# Patient Record
Sex: Female | Born: 1985 | Race: Black or African American | Hispanic: No | Marital: Single | Smoking: Never smoker
Health system: Southern US, Community
[De-identification: ages and names within clinical notes are randomized; demographics above are authoritative.]

## PROBLEM LIST (undated history)

## (undated) DIAGNOSIS — I10 Essential (primary) hypertension: Secondary | ICD-10-CM

## (undated) DIAGNOSIS — E669 Obesity, unspecified: Secondary | ICD-10-CM

## (undated) DIAGNOSIS — L940 Localized scleroderma [morphea]: Secondary | ICD-10-CM

## (undated) DIAGNOSIS — E079 Disorder of thyroid, unspecified: Secondary | ICD-10-CM

## (undated) DIAGNOSIS — F419 Anxiety disorder, unspecified: Secondary | ICD-10-CM

---

## 2006-08-04 ENCOUNTER — Emergency Department (HOSPITAL_COMMUNITY): Admission: EM | Admit: 2006-08-04 | Discharge: 2006-08-04 | Payer: Self-pay | Admitting: Emergency Medicine

## 2006-12-12 ENCOUNTER — Emergency Department (HOSPITAL_COMMUNITY): Admission: EM | Admit: 2006-12-12 | Discharge: 2006-12-13 | Payer: Self-pay | Admitting: Emergency Medicine

## 2006-12-17 ENCOUNTER — Ambulatory Visit: Payer: Self-pay | Admitting: *Deleted

## 2007-01-18 ENCOUNTER — Ambulatory Visit: Payer: Self-pay | Admitting: Internal Medicine

## 2007-02-12 ENCOUNTER — Ambulatory Visit: Payer: Self-pay | Admitting: Internal Medicine

## 2007-03-19 ENCOUNTER — Ambulatory Visit: Payer: Self-pay | Admitting: Internal Medicine

## 2007-09-08 ENCOUNTER — Emergency Department (HOSPITAL_COMMUNITY): Admission: EM | Admit: 2007-09-08 | Discharge: 2007-09-08 | Payer: Self-pay | Admitting: Emergency Medicine

## 2007-11-22 ENCOUNTER — Inpatient Hospital Stay (HOSPITAL_COMMUNITY): Admission: AD | Admit: 2007-11-22 | Discharge: 2007-11-22 | Payer: Self-pay | Admitting: Gynecology

## 2007-11-25 ENCOUNTER — Ambulatory Visit: Payer: Self-pay | Admitting: Gynecology

## 2007-11-25 ENCOUNTER — Ambulatory Visit (HOSPITAL_COMMUNITY): Admission: RE | Admit: 2007-11-25 | Discharge: 2007-11-25 | Payer: Self-pay | Admitting: Gynecology

## 2007-11-25 ENCOUNTER — Encounter (INDEPENDENT_AMBULATORY_CARE_PROVIDER_SITE_OTHER): Payer: Self-pay | Admitting: Gynecology

## 2009-10-02 ENCOUNTER — Inpatient Hospital Stay (HOSPITAL_COMMUNITY): Admission: AD | Admit: 2009-10-02 | Discharge: 2009-10-02 | Payer: Self-pay | Admitting: Obstetrics & Gynecology

## 2010-11-18 ENCOUNTER — Emergency Department (HOSPITAL_COMMUNITY)
Admission: EM | Admit: 2010-11-18 | Discharge: 2010-11-18 | Payer: Self-pay | Source: Home / Self Care | Admitting: Emergency Medicine

## 2010-11-28 LAB — URINALYSIS, ROUTINE W REFLEX MICROSCOPIC
Bilirubin Urine: NEGATIVE
Ketones, ur: NEGATIVE mg/dL
Nitrite: NEGATIVE
Protein, ur: NEGATIVE mg/dL
Specific Gravity, Urine: 1.021 (ref 1.005–1.030)
Urine Glucose, Fasting: NEGATIVE mg/dL
Urobilinogen, UA: 1 mg/dL (ref 0.0–1.0)
pH: 6.5 (ref 5.0–8.0)

## 2010-11-28 LAB — URINE MICROSCOPIC-ADD ON

## 2010-11-28 LAB — POCT PREGNANCY, URINE: Preg Test, Ur: NEGATIVE

## 2010-12-23 ENCOUNTER — Emergency Department (HOSPITAL_COMMUNITY)
Admission: EM | Admit: 2010-12-23 | Discharge: 2010-12-23 | Disposition: A | Discharge status: Home / Self Care | Payer: Medicaid Other | Attending: Emergency Medicine | Admitting: Emergency Medicine

## 2010-12-23 DIAGNOSIS — D649 Anemia, unspecified: Secondary | ICD-10-CM | POA: Insufficient documentation

## 2010-12-23 DIAGNOSIS — E669 Obesity, unspecified: Secondary | ICD-10-CM | POA: Insufficient documentation

## 2010-12-23 DIAGNOSIS — J45909 Unspecified asthma, uncomplicated: Secondary | ICD-10-CM | POA: Insufficient documentation

## 2010-12-23 DIAGNOSIS — L940 Localized scleroderma [morphea]: Secondary | ICD-10-CM | POA: Insufficient documentation

## 2010-12-23 DIAGNOSIS — R5381 Other malaise: Secondary | ICD-10-CM | POA: Insufficient documentation

## 2010-12-23 DIAGNOSIS — N390 Urinary tract infection, site not specified: Secondary | ICD-10-CM | POA: Insufficient documentation

## 2010-12-23 DIAGNOSIS — M2550 Pain in unspecified joint: Secondary | ICD-10-CM | POA: Insufficient documentation

## 2010-12-23 LAB — DIFFERENTIAL
Basophils Absolute: 0 10*3/uL (ref 0.0–0.1)
Basophils Relative: 0 % (ref 0–1)
Eosinophils Absolute: 0.2 10*3/uL (ref 0.0–0.7)
Eosinophils Relative: 2 % (ref 0–5)
Lymphocytes Relative: 23 % (ref 12–46)
Lymphs Abs: 2.7 10*3/uL (ref 0.7–4.0)
Monocytes Absolute: 0.8 10*3/uL (ref 0.1–1.0)
Monocytes Relative: 7 % (ref 3–12)
Neutro Abs: 7.9 10*3/uL — ABNORMAL HIGH (ref 1.7–7.7)
Neutrophils Relative %: 68 % (ref 43–77)

## 2010-12-23 LAB — URINALYSIS, ROUTINE W REFLEX MICROSCOPIC
Bilirubin Urine: NEGATIVE
Ketones, ur: NEGATIVE mg/dL
Nitrite: POSITIVE — AB
Protein, ur: NEGATIVE mg/dL
Specific Gravity, Urine: 1.022 (ref 1.005–1.030)
Urine Glucose, Fasting: NEGATIVE mg/dL
Urobilinogen, UA: 1 mg/dL (ref 0.0–1.0)
pH: 6 (ref 5.0–8.0)

## 2010-12-23 LAB — CBC
HCT: 32.5 % — ABNORMAL LOW (ref 36.0–46.0)
Hemoglobin: 10.1 g/dL — ABNORMAL LOW (ref 12.0–15.0)
MCH: 22.1 pg — ABNORMAL LOW (ref 26.0–34.0)
MCHC: 31.1 g/dL (ref 30.0–36.0)
MCV: 71 fL — ABNORMAL LOW (ref 78.0–100.0)
Platelets: 323 10*3/uL (ref 150–400)
RBC: 4.58 MIL/uL (ref 3.87–5.11)
RDW: 17.6 % — ABNORMAL HIGH (ref 11.5–15.5)
WBC: 11.6 10*3/uL — ABNORMAL HIGH (ref 4.0–10.5)

## 2010-12-23 LAB — POCT I-STAT, CHEM 8
BUN: 8 mg/dL (ref 6–23)
Calcium, Ion: 1.17 mmol/L (ref 1.12–1.32)
Chloride: 103 mEq/L (ref 96–112)
Creatinine, Ser: 0.9 mg/dL (ref 0.4–1.2)
Glucose, Bld: 89 mg/dL (ref 70–99)
HCT: 34 % — ABNORMAL LOW (ref 36.0–46.0)
Hemoglobin: 11.6 g/dL — ABNORMAL LOW (ref 12.0–15.0)
Potassium: 3.6 mEq/L (ref 3.5–5.1)
Sodium: 143 mEq/L (ref 135–145)
TCO2: 27 mmol/L (ref 0–100)

## 2010-12-23 LAB — URINE MICROSCOPIC-ADD ON

## 2010-12-23 LAB — POCT PREGNANCY, URINE: Preg Test, Ur: NEGATIVE

## 2010-12-25 LAB — URINE CULTURE
Colony Count: 40000
Culture  Setup Time: 201202110205

## 2010-12-30 ENCOUNTER — Emergency Department (HOSPITAL_COMMUNITY)
Admission: EM | Admit: 2010-12-30 | Discharge: 2010-12-31 | Disposition: A | Discharge status: Home / Self Care | Payer: Medicaid Other | Attending: Emergency Medicine | Admitting: Emergency Medicine

## 2010-12-30 DIAGNOSIS — K029 Dental caries, unspecified: Secondary | ICD-10-CM | POA: Insufficient documentation

## 2010-12-30 DIAGNOSIS — J45909 Unspecified asthma, uncomplicated: Secondary | ICD-10-CM | POA: Insufficient documentation

## 2010-12-30 DIAGNOSIS — K089 Disorder of teeth and supporting structures, unspecified: Secondary | ICD-10-CM | POA: Insufficient documentation

## 2010-12-30 DIAGNOSIS — L940 Localized scleroderma [morphea]: Secondary | ICD-10-CM | POA: Insufficient documentation

## 2010-12-30 DIAGNOSIS — E669 Obesity, unspecified: Secondary | ICD-10-CM | POA: Insufficient documentation

## 2011-01-10 ENCOUNTER — Emergency Department (HOSPITAL_COMMUNITY)
Admission: EM | Admit: 2011-01-10 | Discharge: 2011-01-10 | Disposition: A | Discharge status: Home / Self Care | Payer: Medicaid Other | Attending: Emergency Medicine | Admitting: Emergency Medicine

## 2011-01-10 ENCOUNTER — Emergency Department (HOSPITAL_COMMUNITY): Payer: Medicaid Other

## 2011-01-10 DIAGNOSIS — M545 Low back pain, unspecified: Secondary | ICD-10-CM | POA: Insufficient documentation

## 2011-01-10 DIAGNOSIS — M79609 Pain in unspecified limb: Secondary | ICD-10-CM | POA: Insufficient documentation

## 2011-01-10 DIAGNOSIS — R55 Syncope and collapse: Secondary | ICD-10-CM | POA: Insufficient documentation

## 2011-01-10 LAB — BASIC METABOLIC PANEL
BUN: 5 mg/dL — ABNORMAL LOW (ref 6–23)
CO2: 26 mEq/L (ref 19–32)
Calcium: 8.8 mg/dL (ref 8.4–10.5)
Chloride: 106 mEq/L (ref 96–112)
Creatinine, Ser: 0.64 mg/dL (ref 0.4–1.2)
GFR calc Af Amer: >60 mL/min (ref >60)
GFR calc non Af Amer: >60 mL/min (ref >60)
Glucose, Bld: 86 mg/dL (ref 70–99)
Potassium: 3.4 mEq/L — ABNORMAL LOW (ref 3.5–5.1)
Sodium: 139 mEq/L (ref 135–145)

## 2011-01-10 LAB — URINE MICROSCOPIC-ADD ON

## 2011-01-10 LAB — DIFFERENTIAL
Basophils Absolute: 0 10*3/uL (ref 0.0–0.1)
Basophils Relative: 0 % (ref 0–1)
Eosinophils Absolute: 0.2 10*3/uL (ref 0.0–0.7)
Eosinophils Relative: 2 % (ref 0–5)
Lymphocytes Relative: 21 % (ref 12–46)
Lymphs Abs: 1.9 10*3/uL (ref 0.7–4.0)
Monocytes Absolute: 0.6 10*3/uL (ref 0.1–1.0)
Monocytes Relative: 7 % (ref 3–12)
Neutro Abs: 6.4 10*3/uL (ref 1.7–7.7)
Neutrophils Relative %: 70 % (ref 43–77)

## 2011-01-10 LAB — URINALYSIS, ROUTINE W REFLEX MICROSCOPIC
Bilirubin Urine: NEGATIVE
Ketones, ur: NEGATIVE mg/dL
Nitrite: NEGATIVE
Protein, ur: 30 mg/dL — AB
Specific Gravity, Urine: 1.015 (ref 1.005–1.030)
Urine Glucose, Fasting: NEGATIVE mg/dL
Urobilinogen, UA: 0.2 mg/dL (ref 0.0–1.0)
pH: 7.5 (ref 5.0–8.0)

## 2011-01-10 LAB — PREGNANCY, URINE: Preg Test, Ur: NEGATIVE

## 2011-01-10 LAB — CBC
HCT: 33.3 % — ABNORMAL LOW (ref 36.0–46.0)
Hemoglobin: 10.3 g/dL — ABNORMAL LOW (ref 12.0–15.0)
MCH: 21.8 pg — ABNORMAL LOW (ref 26.0–34.0)
MCHC: 30.9 g/dL (ref 30.0–36.0)
MCV: 70.6 fL — ABNORMAL LOW (ref 78.0–100.0)
Platelets: 281 10*3/uL (ref 150–400)
RBC: 4.72 MIL/uL (ref 3.87–5.11)
RDW: 17.1 % — ABNORMAL HIGH (ref 11.5–15.5)
WBC: 9.1 10*3/uL (ref 4.0–10.5)

## 2011-02-15 LAB — URINALYSIS, ROUTINE W REFLEX MICROSCOPIC
Bilirubin Urine: NEGATIVE
Glucose, UA: NEGATIVE mg/dL
Hgb urine dipstick: NEGATIVE
Ketones, ur: NEGATIVE mg/dL
Nitrite: POSITIVE — AB
Protein, ur: NEGATIVE mg/dL
Specific Gravity, Urine: 1.02 (ref 1.005–1.030)
Urobilinogen, UA: 0.2 mg/dL (ref 0.0–1.0)
pH: 6 (ref 5.0–8.0)

## 2011-02-15 LAB — URINE MICROSCOPIC-ADD ON

## 2011-02-15 LAB — GC/CHLAMYDIA PROBE AMP, GENITAL: GC Probe Amp, Genital: NEGATIVE

## 2011-02-15 LAB — POCT PREGNANCY, URINE: Preg Test, Ur: POSITIVE

## 2011-02-15 LAB — WET PREP, GENITAL: Clue Cells Wet Prep HPF POC: NONE SEEN

## 2011-03-05 ENCOUNTER — Emergency Department (HOSPITAL_COMMUNITY)
Admission: EM | Admit: 2011-03-05 | Discharge: 2011-03-05 | Disposition: A | Discharge status: Home / Self Care | Payer: Medicaid Other | Attending: Emergency Medicine | Admitting: Emergency Medicine

## 2011-03-05 DIAGNOSIS — R0982 Postnasal drip: Secondary | ICD-10-CM | POA: Insufficient documentation

## 2011-03-05 DIAGNOSIS — J45909 Unspecified asthma, uncomplicated: Secondary | ICD-10-CM | POA: Insufficient documentation

## 2011-03-05 DIAGNOSIS — I10 Essential (primary) hypertension: Secondary | ICD-10-CM | POA: Insufficient documentation

## 2011-03-05 DIAGNOSIS — R059 Cough, unspecified: Secondary | ICD-10-CM | POA: Insufficient documentation

## 2011-03-05 DIAGNOSIS — R05 Cough: Secondary | ICD-10-CM | POA: Insufficient documentation

## 2011-03-05 DIAGNOSIS — J40 Bronchitis, not specified as acute or chronic: Secondary | ICD-10-CM | POA: Insufficient documentation

## 2011-03-05 DIAGNOSIS — J3489 Other specified disorders of nose and nasal sinuses: Secondary | ICD-10-CM | POA: Insufficient documentation

## 2011-03-27 ENCOUNTER — Other Ambulatory Visit: Payer: Self-pay | Admitting: Specialist

## 2011-03-27 ENCOUNTER — Ambulatory Visit
Admission: RE | Admit: 2011-03-27 | Discharge: 2011-03-27 | Disposition: A | Discharge status: Home / Self Care | Payer: Medicaid Other | Source: Ambulatory Visit | Attending: Specialist | Admitting: Specialist

## 2011-03-27 DIAGNOSIS — M545 Low back pain: Secondary | ICD-10-CM

## 2011-03-28 NOTE — Op Note (Signed)
NAMECHRISTENA, Wendy Humphrey               ACCOUNT NO.:  [id number]   MEDICAL RECORD NO.:  [id number]          PATIENT TYPE:  AMB   LOCATION:  SDC                           FACILITY:  [facility]   PHYSICIAN:  Ginger Carne, MD  DATE OF BIRTH:  [birth year]   DATE OF PROCEDURE:  11/25/2007  DATE OF DISCHARGE:                               OPERATIVE REPORT   PREOPERATIVE DIAGNOSIS:  First trimester missed abortion.   POSTOPERATIVE DIAGNOSIS:  First trimester missed abortion.   PROCEDURE:  Exploration, dilation, and extraction.   SURGEON:  Ginger Carne, M.D.   ASSISTANT:  None.   COMPLICATIONS:  None immediate.   ESTIMATED BLOOD LOSS:  Minimal.   SPECIMENS:  To pathology.   FINDINGS:  External genitalia, vulva, and vagina normal.  Cervix is  smooth without erosions or lesions.  Uterus was 10 weeks in size by  sound and size.  Products of conception noted.  Both adnexa palpable and  found to be normal.   DESCRIPTION OF PROCEDURE:  The patient was prepped and draped in the  usual sterile fashion and placed in the lithotomy position.  Betadine  solution was used for antiseptic and the patient was catheterized prior  to the procedure.  After adequate general anesthesia, tenaculum was  placed on the anterior lip of the cervix.  Dilatation to accommodate a  #8 suction cannula was followed by sharp and then suction curettage.  The specimen to pathology.  Minimal bleeding noted at the end of the  procedure.  The patient tolerated the procedure well and returned to the  postanesthesia care unit in excellent condition.      Ginger Carne, MD  Electronically Signed     SHB/MEDQ  D:  11/25/2007  T:  11/25/2007  Job:  [id number]

## 2011-04-07 ENCOUNTER — Emergency Department (HOSPITAL_COMMUNITY)
Admission: EM | Admit: 2011-04-07 | Discharge: 2011-04-07 | Discharge status: Against Medical Advice | Payer: Medicaid Other | Attending: Emergency Medicine | Admitting: Emergency Medicine

## 2011-04-07 DIAGNOSIS — R109 Unspecified abdominal pain: Secondary | ICD-10-CM | POA: Insufficient documentation

## 2011-04-07 DIAGNOSIS — R112 Nausea with vomiting, unspecified: Secondary | ICD-10-CM | POA: Insufficient documentation

## 2011-05-20 ENCOUNTER — Emergency Department (HOSPITAL_COMMUNITY)
Admission: EM | Admit: 2011-05-20 | Discharge: 2011-05-20 | Disposition: A | Discharge status: Home / Self Care | Payer: Medicaid Other | Attending: Emergency Medicine | Admitting: Emergency Medicine

## 2011-05-20 DIAGNOSIS — T63391A Toxic effect of venom of other spider, accidental (unintentional), initial encounter: Secondary | ICD-10-CM | POA: Insufficient documentation

## 2011-05-20 DIAGNOSIS — T6391XA Toxic effect of contact with unspecified venomous animal, accidental (unintentional), initial encounter: Secondary | ICD-10-CM | POA: Insufficient documentation

## 2011-05-20 DIAGNOSIS — R509 Fever, unspecified: Secondary | ICD-10-CM | POA: Insufficient documentation

## 2011-05-20 DIAGNOSIS — L02219 Cutaneous abscess of trunk, unspecified: Secondary | ICD-10-CM | POA: Insufficient documentation

## 2011-05-20 DIAGNOSIS — I10 Essential (primary) hypertension: Secondary | ICD-10-CM | POA: Insufficient documentation

## 2011-08-03 LAB — CBC
HCT: 33.2 — ABNORMAL LOW
Hemoglobin: 11 — ABNORMAL LOW
MCHC: 33.6
MCV: 81.9
Platelets: 264
Platelets: 265
RBC: 4.03
WBC: 9.1

## 2011-08-03 LAB — TYPE AND SCREEN
ABO/RH(D): AB POS
Antibody Screen: NEGATIVE

## 2011-08-03 LAB — POCT PREGNANCY, URINE
Operator id: 220991
Preg Test, Ur: POSITIVE

## 2011-08-03 LAB — ABO/RH: ABO/RH(D): AB POS

## 2011-08-23 LAB — URINALYSIS, ROUTINE W REFLEX MICROSCOPIC
Glucose, UA: NEGATIVE
Ketones, ur: NEGATIVE
Nitrite: NEGATIVE
Protein, ur: NEGATIVE
Urobilinogen, UA: 0.2

## 2011-08-23 LAB — CBC
Hemoglobin: 12.1
MCHC: 33
Platelets: 275
RDW: 14.6 — ABNORMAL HIGH

## 2011-08-23 LAB — COMPREHENSIVE METABOLIC PANEL
ALT: 10
Albumin: 3.4 — ABNORMAL LOW
Alkaline Phosphatase: 88
Calcium: 8.8
Glucose, Bld: 76
Potassium: 3.5
Sodium: 136
Total Protein: 7.6

## 2011-08-23 LAB — URINE MICROSCOPIC-ADD ON

## 2011-08-23 LAB — URINE CULTURE: Colony Count: >=100000

## 2011-08-23 LAB — GC/CHLAMYDIA PROBE AMP, GENITAL
Chlamydia, DNA Probe: NEGATIVE
GC Probe Amp, Genital: NEGATIVE

## 2011-08-23 LAB — DIFFERENTIAL
Eosinophils Absolute: 0.2
Lymphs Abs: 1.3
Monocytes Absolute: 0.6
Monocytes Relative: 7
Neutro Abs: 6.6
Neutrophils Relative %: 76

## 2011-08-23 LAB — WET PREP, GENITAL
Trich, Wet Prep: NONE SEEN
Yeast Wet Prep HPF POC: NONE SEEN

## 2011-11-17 ENCOUNTER — Emergency Department (HOSPITAL_COMMUNITY): Payer: Medicaid Other

## 2011-11-17 ENCOUNTER — Emergency Department (HOSPITAL_COMMUNITY)
Admission: EM | Admit: 2011-11-17 | Discharge: 2011-11-17 | Disposition: A | Discharge status: Home / Self Care | Payer: Medicaid Other | Attending: Emergency Medicine | Admitting: Emergency Medicine

## 2011-11-17 ENCOUNTER — Encounter: Payer: Self-pay | Admitting: *Deleted

## 2011-11-17 DIAGNOSIS — R0602 Shortness of breath: Secondary | ICD-10-CM | POA: Insufficient documentation

## 2011-11-17 DIAGNOSIS — M542 Cervicalgia: Secondary | ICD-10-CM | POA: Insufficient documentation

## 2011-11-17 DIAGNOSIS — R1110 Vomiting, unspecified: Secondary | ICD-10-CM | POA: Insufficient documentation

## 2011-11-17 DIAGNOSIS — R070 Pain in throat: Secondary | ICD-10-CM | POA: Insufficient documentation

## 2011-11-17 DIAGNOSIS — R059 Cough, unspecified: Secondary | ICD-10-CM | POA: Insufficient documentation

## 2011-11-17 DIAGNOSIS — R51 Headache: Secondary | ICD-10-CM | POA: Insufficient documentation

## 2011-11-17 DIAGNOSIS — R05 Cough: Secondary | ICD-10-CM | POA: Insufficient documentation

## 2011-11-17 DIAGNOSIS — I10 Essential (primary) hypertension: Secondary | ICD-10-CM | POA: Insufficient documentation

## 2011-11-17 DIAGNOSIS — A599 Trichomoniasis, unspecified: Secondary | ICD-10-CM

## 2011-11-17 DIAGNOSIS — R112 Nausea with vomiting, unspecified: Secondary | ICD-10-CM | POA: Insufficient documentation

## 2011-11-17 DIAGNOSIS — R062 Wheezing: Secondary | ICD-10-CM | POA: Insufficient documentation

## 2011-11-17 HISTORY — DX: Localized scleroderma (morphea): L94.0

## 2011-11-17 HISTORY — DX: Obesity, unspecified: E66.9

## 2011-11-17 LAB — URINE MICROSCOPIC-ADD ON

## 2011-11-17 LAB — URINALYSIS, ROUTINE W REFLEX MICROSCOPIC
Glucose, UA: NEGATIVE mg/dL
Hgb urine dipstick: NEGATIVE
Ketones, ur: NEGATIVE mg/dL
Protein, ur: NEGATIVE mg/dL
Urobilinogen, UA: 0.2 mg/dL (ref 0.0–1.0)

## 2011-11-17 MED ORDER — KETOROLAC TROMETHAMINE 60 MG/2ML IM SOLN
60.0000 mg | Freq: Once | INTRAMUSCULAR | Status: AC
  Administered 2011-11-17: 60 mg via INTRAMUSCULAR
  Filled 2011-11-17: qty 2

## 2011-11-17 MED ORDER — CIPROFLOXACIN HCL 500 MG PO TABS
500.0000 mg | ORAL_TABLET | Freq: Once | ORAL | Status: AC
  Administered 2011-11-17: 500 mg via ORAL
  Filled 2011-11-17: qty 1

## 2011-11-17 MED ORDER — HYDROCODONE-ACETAMINOPHEN 5-325 MG PO TABS: 1.0000 | ORAL_TABLET | Freq: Four times a day (QID) | ORAL | Status: AC | PRN

## 2011-11-17 MED ORDER — ALBUTEROL SULFATE (5 MG/ML) 0.5% IN NEBU
5.0000 mg | INHALATION_SOLUTION | Freq: Once | RESPIRATORY_TRACT | Status: AC
  Administered 2011-11-17: 5 mg via RESPIRATORY_TRACT
  Filled 2011-11-17: qty 1

## 2011-11-17 MED ORDER — HYDROCODONE-ACETAMINOPHEN 5-325 MG PO TABS
2.0000 | ORAL_TABLET | Freq: Once | ORAL | Status: AC
  Administered 2011-11-17: 2 via ORAL
  Filled 2011-11-17 (×2): qty 1

## 2011-11-17 MED ORDER — ONDANSETRON 4 MG PO TBDP
4.0000 mg | ORAL_TABLET | Freq: Once | ORAL | Status: AC
  Administered 2011-11-17: 4 mg via ORAL
  Filled 2011-11-17: qty 1

## 2011-11-17 MED ORDER — METRONIDAZOLE 500 MG PO TABS
2000.0000 mg | ORAL_TABLET | Freq: Once | ORAL | Status: AC
  Administered 2011-11-17: 2000 mg via ORAL
  Filled 2011-11-17: qty 4

## 2011-11-17 MED ORDER — CIPROFLOXACIN HCL 500 MG PO TABS: 500.0000 mg | ORAL_TABLET | Freq: Two times a day (BID) | ORAL | Status: AC

## 2011-11-17 MED ORDER — DEXAMETHASONE SODIUM PHOSPHATE 10 MG/ML IJ SOLN
10.0000 mg | Freq: Once | INTRAMUSCULAR | Status: AC
  Administered 2011-11-17: 10 mg via INTRAMUSCULAR
  Filled 2011-11-17: qty 1

## 2011-11-17 NOTE — ED Notes (Signed)
Reports migraine headache and n/v x 2 days. Having a cough and sob. Airway is intact at triage. Skin w/d.

## 2011-11-17 NOTE — ED Provider Notes (Signed)
History     CSN: [id number]  Arrival date & time 11/17/11  1032   First MD Initiated Contact with Patient 11/17/11 1153     12:28 PM HPI Patient reports for the last 2 days has had coughing, wheezing coughing shortness of breath, and a migraine headache. Reports migraine headache associated. Causing nausea and vomiting as well. Denies fever, rhinorrhea, midline neck pain. Reports migraine headache is throbbing and frontal. Denies numbness, tingling, weakness, facial droop, difficult to with speech or walking. Denies chest pain. Patient is a 26 y.o. female presenting with headaches. The history is provided by the patient.  Headache  This is a new problem. The current episode started 2 days ago. The problem occurs constantly. The headache is associated with nothing. The pain is located in the frontal region. The pain is moderate. The pain radiates to the right neck. Associated symptoms include shortness of breath and vomiting. Pertinent negatives include no anorexia, no fever, no malaise/fatigue, no chest pressure, no near-syncope, no orthopnea, no palpitations, no syncope and no nausea.    Past Medical History  Diagnosis Date  . Obesity   . Asthma   . Morphea   . Migraine     History reviewed. No pertinent past surgical history.  History reviewed. No pertinent family history.  History  Substance Use Topics  . Smoking status: Not on file  . Smokeless tobacco: Not on file  . Alcohol Use: No    OB History    Grav Para Term Preterm Abortions TAB SAB Ect Mult Living                  Review of Systems  Constitutional: Negative for fever, chills and malaise/fatigue.  HENT: Positive for sore throat and neck pain (right sided). Negative for ear pain, congestion, rhinorrhea, neck stiffness and sinus pressure.   Respiratory: Positive for cough and shortness of breath.   Cardiovascular: Negative for chest pain, palpitations, orthopnea, syncope and near-syncope.  Gastrointestinal:  Positive for vomiting. Negative for nausea, abdominal pain and anorexia.  Neurological: Positive for headaches. Negative for dizziness, tremors, seizures, speech difficulty, weakness, light-headedness and numbness.  All other systems reviewed and are negative.    Allergies  Review of patient's allergies indicates no known allergies.  Home Medications  No current outpatient prescriptions on file.  BP 161/105  Pulse 93  Temp(Src) 97.9 F (36.6 C) (Oral)  Resp 18  SpO2 98%  LMP 10/29/2011  Physical Exam  Vitals reviewed. Constitutional: She is oriented to person, place, and time. Vital signs are normal. She appears well-developed and well-nourished.       Hypertension  HENT:  Head: Normocephalic and atraumatic. No trismus in the jaw.  Right Ear: Tympanic membrane, external ear and ear canal normal.  Left Ear: Tympanic membrane, external ear and ear canal normal.  Nose: Nose normal. Right sinus exhibits no maxillary sinus tenderness and no frontal sinus tenderness. Left sinus exhibits no maxillary sinus tenderness and no frontal sinus tenderness.  Mouth/Throat: Uvula is midline and oropharynx is clear and moist. No dental abscesses or uvula swelling. No oropharyngeal exudate, posterior oropharyngeal edema, posterior oropharyngeal erythema or tonsillar abscesses.  Eyes: Conjunctivae are normal. Pupils are equal, round, and reactive to light.  Neck: Normal range of motion and full passive range of motion without pain. Neck supple. No spinous process tenderness and no muscular tenderness present. No rigidity. Normal range of motion present. No thyromegaly present.  Cardiovascular: Normal rate, regular rhythm and normal heart sounds.  Exam reveals no friction rub.   No murmur heard. Pulmonary/Chest: Effort normal and breath sounds normal. She has no wheezes. She has no rhonchi. She has no rales. She exhibits no tenderness.  Abdominal: Soft. Bowel sounds are normal. She exhibits no  distension and no mass. There is no tenderness. There is no rebound and no guarding.  Musculoskeletal: Normal range of motion. She exhibits no edema and no tenderness.  Neurological: She is alert and oriented to person, place, and time. No cranial nerve deficit. Coordination normal.  Skin: Skin is warm and dry. No rash noted. No erythema. No pallor.    ED Course  Procedures Results for orders placed during the hospital encounter of 11/17/11  URINALYSIS, ROUTINE W REFLEX MICROSCOPIC      Component Value Range   Color, Urine YELLOW  YELLOW    APPearance HAZY (*) CLEAR    Specific Gravity, Urine 1.025  1.005 - 1.030    pH 7.0  5.0 - 8.0    Glucose, UA NEGATIVE  NEGATIVE (mg/dL)   Hgb urine dipstick NEGATIVE  NEGATIVE    Bilirubin Urine NEGATIVE  NEGATIVE    Ketones, ur NEGATIVE  NEGATIVE (mg/dL)   Protein, ur NEGATIVE  NEGATIVE (mg/dL)   Urobilinogen, UA 0.2  0.0 - 1.0 (mg/dL)   Nitrite POSITIVE (*) NEGATIVE    Leukocytes, UA MODERATE (*) NEGATIVE   POCT PREGNANCY, URINE      Component Value Range   Preg Test, Ur NEGATIVE    URINE MICROSCOPIC-ADD ON      Component Value Range   Squamous Epithelial / LPF MANY (*) RARE    WBC, UA 7-10  <3 (WBC/hpf)   RBC / HPF 0-2  <3 (RBC/hpf)   Bacteria, UA MANY (*) RARE    Urine-Other TRICHOMONAS PRESENT     Dg Chest 2 View  11/17/2011  *RADIOLOGY REPORT*  Clinical Data: Cough with wheezing.  CHEST - 2 VIEW  Comparison: None.  Findings: Normal heart size with clear lung fields.  No bony abnormality.  IMPRESSION: No active cardiopulmonary disease.  Original Report Authenticated By: Elsie Stain, M.D.     MDM         Thomasene Lot, [place] 11/17/11 [phone]

## 2011-11-17 NOTE — ED Notes (Signed)
Pt staes cough wheezing and vomiting x 2-3 days

## 2011-11-21 NOTE — ED Provider Notes (Signed)
Medical screening examination/treatment/procedure(s) were performed by non-physician practitioner and as supervising physician I was immediately available for consultation/collaboration.   Xareni Kelch, MD 11/21/11 1252 

## 2012-07-08 IMAGING — CR DG LUMBAR SPINE COMPLETE 4+V
5 series · 5 of 5 positions shown · non-contrast
Comparison: Lumbar spine films of 01/10/2011

CLINICAL DATA: Lower back pain radiating down legs, no trauma

LUMBAR SPINE - COMPLETE 4+ VIEW

[t l-spine a.p.]
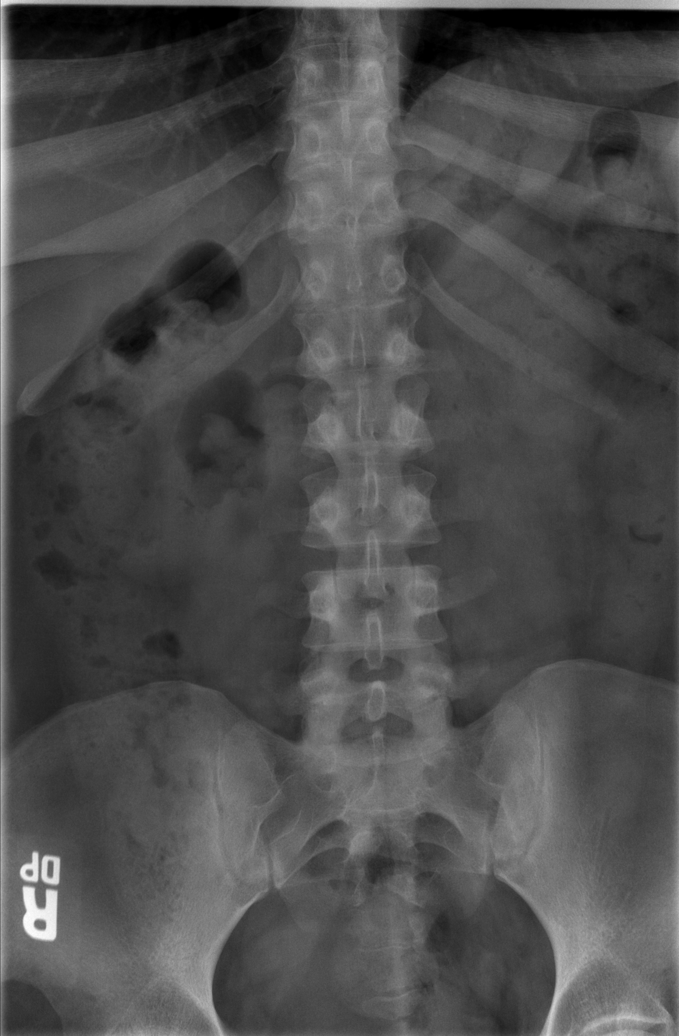

[t l-spine oblique exposure (1 of 2)]
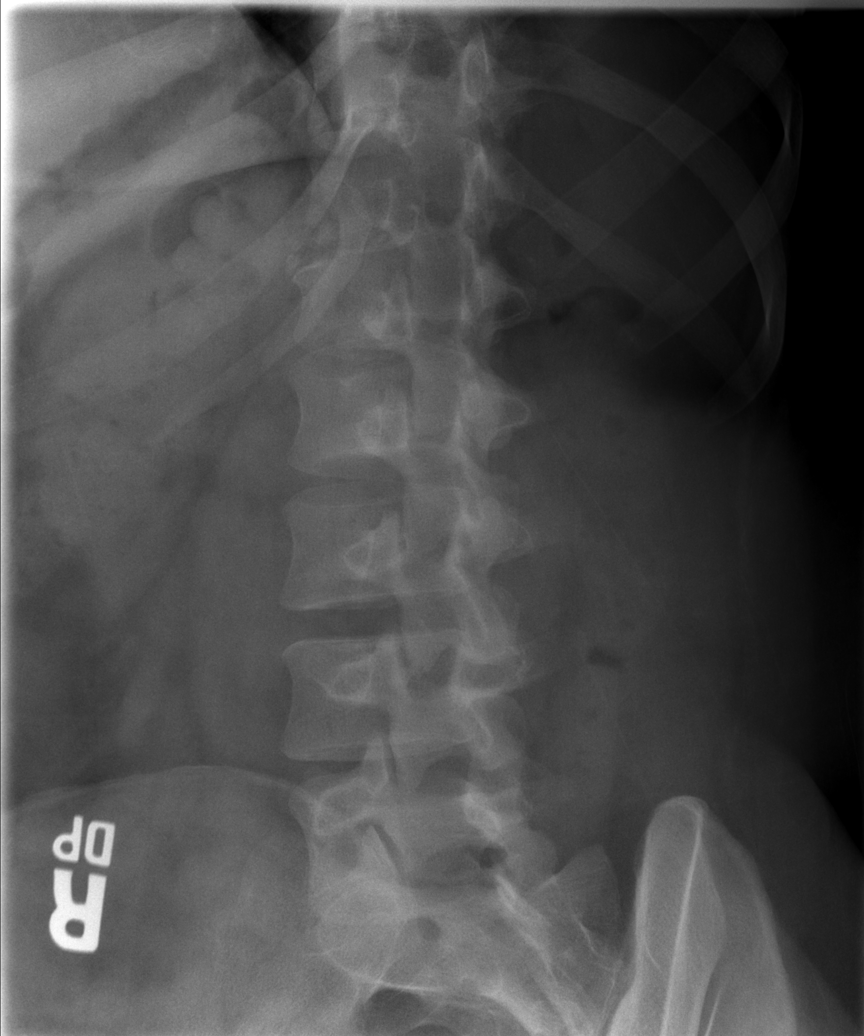

[t l-spine oblique exposure (2 of 2)]
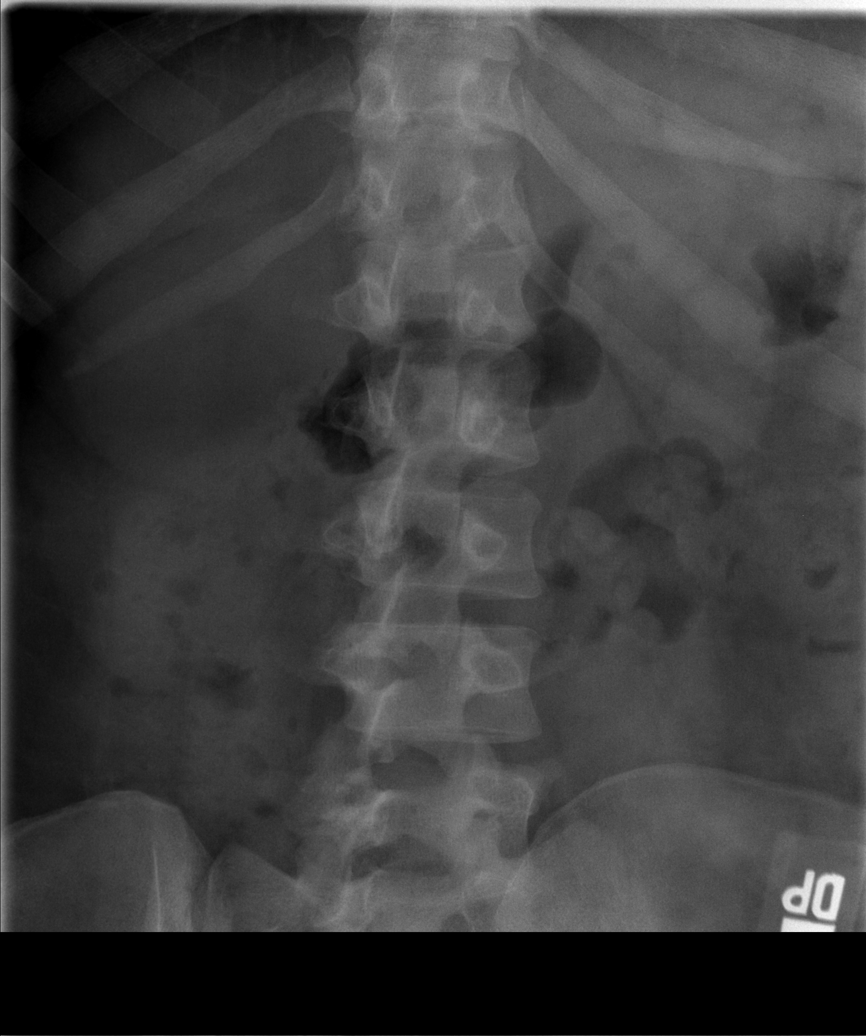

[t l-spine lat]
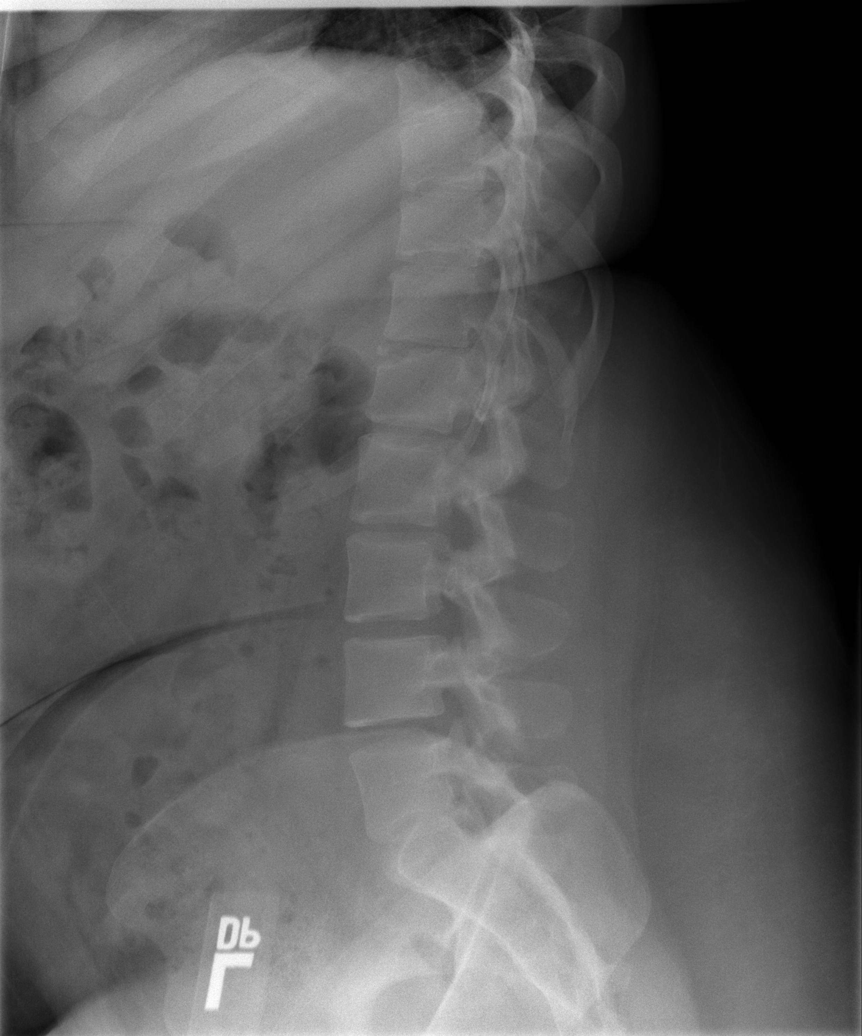

[t l-spine l5-s1 spot]
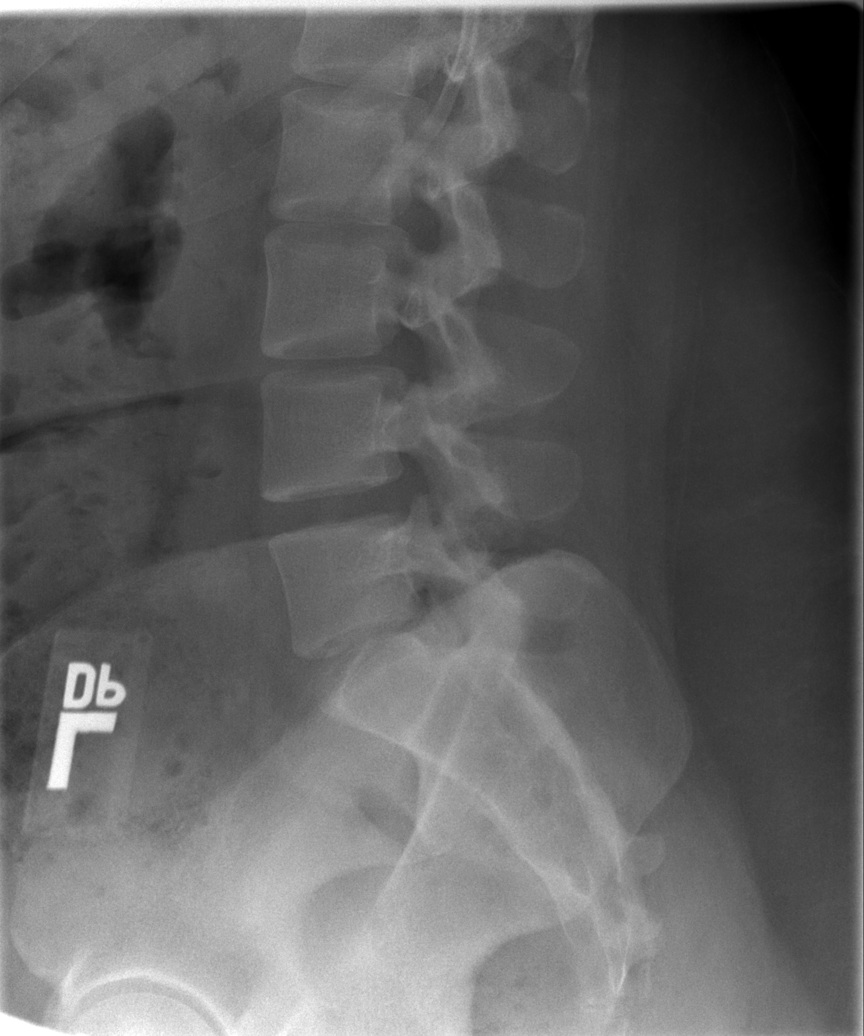

[5 of 5 positions shown; findings below may reference images not displayed]

FINDINGS: The lumbar vertebrae remain in normal alignment with
normal intervertebral disc spaces.  No compression deformity is
seen.  Variation of a limbus vertebra involving the anterior
superior aspect of L1 is noted which is a normal variation.  The SI
joints appear normal.
IMPRESSION: Normal alignment.  Normal disc spaces.  No acute abnormality.

## 2014-05-26 ENCOUNTER — Encounter (HOSPITAL_COMMUNITY): Payer: Self-pay | Admitting: Emergency Medicine

## 2014-05-26 ENCOUNTER — Emergency Department (HOSPITAL_COMMUNITY)
Admission: EM | Admit: 2014-05-26 | Discharge: 2014-05-26 | Disposition: A | Discharge status: Home / Self Care | Payer: Medicaid Other | Attending: Emergency Medicine | Admitting: Emergency Medicine

## 2014-05-26 DIAGNOSIS — I10 Essential (primary) hypertension: Secondary | ICD-10-CM | POA: Diagnosis not present

## 2014-05-26 DIAGNOSIS — H579 Unspecified disorder of eye and adnexa: Secondary | ICD-10-CM | POA: Diagnosis present

## 2014-05-26 DIAGNOSIS — E669 Obesity, unspecified: Secondary | ICD-10-CM | POA: Insufficient documentation

## 2014-05-26 DIAGNOSIS — Z79899 Other long term (current) drug therapy: Secondary | ICD-10-CM | POA: Diagnosis not present

## 2014-05-26 DIAGNOSIS — H5710 Ocular pain, unspecified eye: Secondary | ICD-10-CM | POA: Insufficient documentation

## 2014-05-26 DIAGNOSIS — Z872 Personal history of diseases of the skin and subcutaneous tissue: Secondary | ICD-10-CM | POA: Diagnosis not present

## 2014-05-26 DIAGNOSIS — H5711 Ocular pain, right eye: Secondary | ICD-10-CM

## 2014-05-26 DIAGNOSIS — J45909 Unspecified asthma, uncomplicated: Secondary | ICD-10-CM | POA: Insufficient documentation

## 2014-05-26 HISTORY — DX: Essential (primary) hypertension: I10

## 2014-05-26 MED ORDER — IBUPROFEN 200 MG PO TABS
600.0000 mg | ORAL_TABLET | Freq: Once | ORAL | Status: AC
  Administered 2014-05-26: 600 mg via ORAL
  Filled 2014-05-26: qty 3

## 2014-05-26 MED ORDER — HYDROCODONE-ACETAMINOPHEN 5-325 MG PO TABS: 1.0000 | ORAL_TABLET | ORAL | Status: DC | PRN

## 2014-05-26 MED ORDER — OXYCODONE-ACETAMINOPHEN 5-325 MG PO TABS
1.0000 | ORAL_TABLET | Freq: Once | ORAL | Status: AC
  Administered 2014-05-26: 1 via ORAL
  Filled 2014-05-26: qty 1

## 2014-05-26 NOTE — ED Notes (Signed)
Called mother for ride home as requested.

## 2014-05-26 NOTE — ED Notes (Signed)
She states " i think my blood pressure might be up because ive had pressure behind my eyes and felt faint since this morning." shes A&Ox4, breathing easily

## 2014-05-26 NOTE — ED Provider Notes (Signed)
CSN: [id number]     Arrival date & time 05/26/14  1734 History   First MD Initiated Contact with Patient 05/26/14 1807     Chief Complaint  Patient presents with  . Hypertension      HPI Patient reports a pressure sensation behind her right eye.  She reports no change in her vision.  She denies dental pain or upper respiratory symptoms.  No fevers or chills.  No significant pain with range of motion of her eyes.  She's never had issues before.  She's not wear corrective lenses.  She denies chest pain shortness of breath.  No abdominal pain.  No nausea or vomiting.  No recent trauma or injury.  No change in her vision.  She's concerned this could be related to her blood pressure   Past Medical History  Diagnosis Date  . Obesity   . Asthma   . Morphea   . Migraine   . Hypertension    History reviewed. No pertinent past surgical history. History reviewed. No pertinent family history. History  Substance Use Topics  . Smoking status: Never Smoker   . Smokeless tobacco: Not on file  . Alcohol Use: No   OB History   Grav Para Term Preterm Abortions TAB SAB Ect Mult Living                 Review of Systems    Allergies  Review of patient's allergies indicates no known allergies.  Home Medications   Prior to Admission medications   Medication Sig Start Date End Date Taking? Authorizing Provider  metFORMIN (GLUCOPHAGE) 500 MG tablet Take 500 mg by mouth 2 (two) times daily with a meal.   Yes Historical Provider, MD   BP 150/109  Pulse 103  Temp(Src) 98 F (36.7 C) (Oral)  Resp 18  Ht 5\' 4"  (1.626 m)  Wt 345 lb 11.2 oz (156.808 kg)  BMI 59.31 kg/m2  SpO2 100% Physical Exam  Nursing note and vitals reviewed. Constitutional: She is oriented to person, place, and time. She appears well-developed and well-nourished. No distress.  HENT:  Head: Normocephalic and atraumatic.  Eyes: EOM and lids are normal. Right eye exhibits no chemosis and no discharge. Left eye exhibits  no chemosis and no discharge. Right conjunctiva is not injected. Right conjunctiva has no hemorrhage. Left conjunctiva is not injected. Left conjunctiva has no hemorrhage. Right eye exhibits normal extraocular motion and no nystagmus. Left eye exhibits normal extraocular motion and no nystagmus. Right pupil is round and reactive. Left pupil is round and reactive. Pupils are equal.  Neck: Normal range of motion.  Cardiovascular: Normal rate, regular rhythm and normal heart sounds.   Pulmonary/Chest: Effort normal and breath sounds normal.  Abdominal: Soft. She exhibits no distension. There is no tenderness.  Musculoskeletal: Normal range of motion.  Neurological: She is alert and oriented to person, place, and time.  5/5 strength in major muscle groups of  bilateral upper and lower extremities. Speech normal. No facial asymetry.   Skin: Skin is warm and dry.  Psychiatric: She has a normal mood and affect. Judgment normal.    ED Course  Procedures (including critical care time) Labs Review Labs Reviewed - No data to display  Imaging Review No results found.   EKG Interpretation   Date/Time:  Tuesday May 26 2014 17:45:10 EDT Ventricular Rate:  96 PR Interval:  142 QRS Duration: 82 QT Interval:  370 QTC Calculation: 467 R Axis:   51 Text Interpretation:  Normal sinus rhythm T wave abnormality, consider  inferior ischemia Prolonged QT Abnormal ECG No significant change was  found Confirmed by Suad Autrey  MD, Lennette Bihari ([id number]) on 05/26/2014 6:10:49 PM      MDM   Final diagnoses:  None    Patient feels better.  May represent headache.  Home with ophthalmology followup.  No indication for additional testing and thigh.  No blurred vision.  Discharge home in good condition.  No indication for CT imaging    Hoy Morn, MD 05/26/14 1907

## 2014-05-26 NOTE — ED Notes (Signed)
Patient states pain level is a 6 now. She says that her headaches is easing up a little bit.

## 2014-05-26 NOTE — ED Notes (Signed)
Pt's mother's number for discharge. Shirlean Mylar [phone]. Call for discharge when ready

## 2014-05-26 NOTE — Discharge Instructions (Signed)

## 2014-05-26 NOTE — ED Notes (Signed)
Dr. Campos at the bedside.  

## 2014-07-09 ENCOUNTER — Emergency Department (HOSPITAL_COMMUNITY)
Admission: EM | Admit: 2014-07-09 | Discharge: 2014-07-09 | Discharge status: Against Medical Advice | Payer: Medicaid Other | Attending: Emergency Medicine | Admitting: Emergency Medicine

## 2014-07-09 ENCOUNTER — Emergency Department (HOSPITAL_COMMUNITY): Payer: Medicaid Other

## 2014-07-09 ENCOUNTER — Encounter (HOSPITAL_COMMUNITY): Payer: Self-pay | Admitting: Emergency Medicine

## 2014-07-09 DIAGNOSIS — I10 Essential (primary) hypertension: Secondary | ICD-10-CM | POA: Diagnosis not present

## 2014-07-09 DIAGNOSIS — J45909 Unspecified asthma, uncomplicated: Secondary | ICD-10-CM | POA: Diagnosis not present

## 2014-07-09 DIAGNOSIS — E669 Obesity, unspecified: Secondary | ICD-10-CM | POA: Diagnosis not present

## 2014-07-09 DIAGNOSIS — R51 Headache: Secondary | ICD-10-CM | POA: Insufficient documentation

## 2014-07-09 MED ORDER — OXYCODONE-ACETAMINOPHEN 5-325 MG PO TABS
1.0000 | ORAL_TABLET | Freq: Once | ORAL | Status: AC
  Administered 2014-07-09: 1 via ORAL
  Filled 2014-07-09: qty 1

## 2014-07-09 NOTE — ED Notes (Signed)
Pt approached [facility] stating she was going to leave. RN encouraged patient to stay that she was next to be taken back to a treatment room. Pt states she needed to leave due to family issues at home. Pt verbalized that she would return if her symptoms get worse or do not improve.

## 2014-07-09 NOTE — ED Notes (Signed)
Present with 2-3 weeks of occiput pain described as shooting and sharp and intermittent, pain is on the back top right side of head and not currently hurting at this time. When pain is gone-it is gone, but whenit is there it is severe and associated with dizziness and right arm "feels like it locks up" no pain at this time. Pt alert and oriented, MAEx4.

## 2014-10-30 ENCOUNTER — Emergency Department (HOSPITAL_COMMUNITY)
Admission: EM | Admit: 2014-10-30 | Discharge: 2014-10-30 | Disposition: A | Discharge status: Home / Self Care | Payer: Medicaid Other | Attending: Emergency Medicine | Admitting: Emergency Medicine

## 2014-10-30 ENCOUNTER — Emergency Department (HOSPITAL_COMMUNITY): Payer: Medicaid Other

## 2014-10-30 ENCOUNTER — Encounter (HOSPITAL_COMMUNITY): Payer: Self-pay | Admitting: Emergency Medicine

## 2014-10-30 DIAGNOSIS — I10 Essential (primary) hypertension: Secondary | ICD-10-CM | POA: Insufficient documentation

## 2014-10-30 DIAGNOSIS — R0602 Shortness of breath: Secondary | ICD-10-CM

## 2014-10-30 DIAGNOSIS — Z3202 Encounter for pregnancy test, result negative: Secondary | ICD-10-CM | POA: Diagnosis not present

## 2014-10-30 DIAGNOSIS — Z872 Personal history of diseases of the skin and subcutaneous tissue: Secondary | ICD-10-CM | POA: Diagnosis not present

## 2014-10-30 DIAGNOSIS — E669 Obesity, unspecified: Secondary | ICD-10-CM | POA: Insufficient documentation

## 2014-10-30 DIAGNOSIS — R079 Chest pain, unspecified: Secondary | ICD-10-CM | POA: Diagnosis present

## 2014-10-30 DIAGNOSIS — N390 Urinary tract infection, site not specified: Secondary | ICD-10-CM | POA: Diagnosis not present

## 2014-10-30 DIAGNOSIS — J45901 Unspecified asthma with (acute) exacerbation: Secondary | ICD-10-CM | POA: Diagnosis not present

## 2014-10-30 DIAGNOSIS — Z79899 Other long term (current) drug therapy: Secondary | ICD-10-CM | POA: Insufficient documentation

## 2014-10-30 LAB — URINALYSIS, ROUTINE W REFLEX MICROSCOPIC
Bilirubin Urine: NEGATIVE
GLUCOSE, UA: NEGATIVE mg/dL
HGB URINE DIPSTICK: NEGATIVE
Ketones, ur: NEGATIVE mg/dL
Nitrite: POSITIVE — AB
PROTEIN: NEGATIVE mg/dL
SPECIFIC GRAVITY, URINE: 1.027 (ref 1.005–1.030)
UROBILINOGEN UA: 1 mg/dL (ref 0.0–1.0)
pH: 6 (ref 5.0–8.0)

## 2014-10-30 LAB — BASIC METABOLIC PANEL
Anion gap: 12 (ref 5–15)
BUN: 12 mg/dL (ref 6–23)
CALCIUM: 9.1 mg/dL (ref 8.4–10.5)
CO2: 23 meq/L (ref 19–32)
Chloride: 102 mEq/L (ref 96–112)
Creatinine, Ser: 0.77 mg/dL (ref 0.50–1.10)
GFR calc Af Amer: >90 mL/min (ref >90)
GLUCOSE: 84 mg/dL (ref 70–99)
Potassium: 3.4 mEq/L — ABNORMAL LOW (ref 3.7–5.3)
Sodium: 137 mEq/L (ref 137–147)

## 2014-10-30 LAB — CBC
HEMATOCRIT: 31.8 % — AB (ref 36.0–46.0)
HEMOGLOBIN: 9.7 g/dL — AB (ref 12.0–15.0)
MCH: 21.9 pg — AB (ref 26.0–34.0)
MCHC: 30.5 g/dL (ref 30.0–36.0)
MCV: 71.8 fL — AB (ref 78.0–100.0)
Platelets: 267 10*3/uL (ref 150–400)
RBC: 4.43 MIL/uL (ref 3.87–5.11)
RDW: 17.2 % — ABNORMAL HIGH (ref 11.5–15.5)
WBC: 12.1 10*3/uL — ABNORMAL HIGH (ref 4.0–10.5)

## 2014-10-30 LAB — I-STAT TROPONIN, ED: Troponin i, poc: 0 ng/mL (ref 0.00–0.08)

## 2014-10-30 LAB — URINE MICROSCOPIC-ADD ON

## 2014-10-30 LAB — POC URINE PREG, ED: Preg Test, Ur: NEGATIVE

## 2014-10-30 MED ORDER — CEPHALEXIN 500 MG PO CAPS: 500.0000 mg | ORAL_CAPSULE | Freq: Four times a day (QID) | ORAL | Status: DC

## 2014-10-30 MED ORDER — ALBUTEROL SULFATE HFA 108 (90 BASE) MCG/ACT IN AERS
2.0000 | INHALATION_SPRAY | RESPIRATORY_TRACT | Status: DC | PRN
  Administered 2014-10-30: 2 via RESPIRATORY_TRACT
  Filled 2014-10-30: qty 6.7

## 2014-10-30 NOTE — ED Notes (Signed)
Patient transported to X-ray 

## 2014-10-30 NOTE — ED Notes (Signed)
Pt c/o chest heaviness while at work. Reports having pain this morning and went into work. Initially, she thought "the cold air was making my chest hurt since I have asthma." Heaviness persisted associated with shortness of breath. Pt also c/o low back pain; hx of UTI; denies burning with urination

## 2014-10-30 NOTE — Discharge Instructions (Signed)
Return to the ED with any concerns including difficulty breathing despite using 2 puffs of albuterol every 4 hours, not drinking fluids, decreased urine output, vomiting and not able to keep down liquids or medications, decreased level of alertness/lethargy, or any other alarming symptoms

## 2014-10-30 NOTE — ED Notes (Signed)
Pt now stating that she is also having back pain and thinks she may have UTI.

## 2014-10-30 NOTE — ED Notes (Signed)
Pt reports chest heaviness all day at work with headache. sts pains where getting worse throughout the day. Skin warm and dry. resp e/u.

## 2014-10-30 NOTE — ED Provider Notes (Signed)
CSN: [id number]     Arrival date & time 10/30/14  1959 History   First MD Initiated Contact with Patient 10/30/14 2107     Chief Complaint  Patient presents with  . Chest Pain     (Consider location/radiation/quality/duration/timing/severity/associated sxs/prior Treatment) HPI  Pt with hx of asthma presents with c/o chest heaviness and tightness with her breathing.  She also c/o some dysuria.  Symptoms began this morning.  She feels the cold air may have made her symptoms worse.  No fever/cough.  She does not have an albuterol inhaler but does have an advair diskus which did not provide any relief.  States symptoms feel similar to her prior asthma.  No leg swelling.  No hx of DVT/PE, no recent travel/trauma or surgery.  Symptoms are constant.  They are not exertional in nature.  There are no other associated systemic symptoms, there are no other alleviating or modifying factors.   Past Medical History  Diagnosis Date  . Obesity   . Asthma   . Morphea   . Migraine   . Hypertension    History reviewed. No pertinent past surgical history. No family history on file. History  Substance Use Topics  . Smoking status: Never Smoker   . Smokeless tobacco: Not on file  . Alcohol Use: No   OB History    No data available     Review of Systems  ROS reviewed and all otherwise negative except for mentioned in HPI    Allergies  Review of patient's allergies indicates no known allergies.  Home Medications   Prior to Admission medications   Medication Sig Start Date End Date Taking? Authorizing Provider  amLODipine (NORVASC) 10 MG tablet Take 10 mg by mouth daily. 09/14/14  Yes Historical Provider, MD  Levothyroxine Sodium (SYNTHROID PO) Take 1 tablet by mouth daily.   Yes Historical Provider, MD  lisinopril (PRINIVIL,ZESTRIL) 40 MG tablet Take 40 mg by mouth daily. 09/14/14  Yes Historical Provider, MD  metoprolol (LOPRESSOR) 50 MG tablet Take 50 mg by mouth 2 (two) times daily. 09/14/14   Yes Historical Provider, MD  PROAIR HFA 108 (90 BASE) MCG/ACT inhaler Inhale 2 puffs into the lungs daily as needed for wheezing or shortness of breath.  10/29/14  Yes Historical Provider, MD  cephALEXin (KEFLEX) 500 MG capsule Take 1 capsule (500 mg total) by mouth 4 (four) times daily. 10/30/14   Threasa Beards, MD   BP 154/91 mmHg  Pulse 79  Temp(Src) 97.5 F (36.4 C) (Oral)  Resp 16  Ht 5\' 4"  (1.626 m)  Wt 345 lb (156.491 kg)  BMI 59.19 kg/m2  SpO2 100%  LMP 10/07/2014 (Approximate)  Vitals reviewed Physical Exam  Physical Examination: General appearance - alert, well appearing, and in no distress Mental status - alert, oriented to person, place, and time Eyes - no conjunctival injection, no scleral icterus Mouth - mucous membranes moist, pharynx normal without lesions Chest - clear to auscultation, mild bilateral end expiratory wheezes, rales or rhonchi, symmetric air entry, normal respiratory effort.  Heart - normal rate, regular rhythm, normal S1, S2, no murmurs, rubs, clicks or gallops Abdomen - soft, nontender, nondistended, no masses or organomegaly Extremities - peripheral pulses normal, no pedal edema, no clubbing or cyanosis Skin - normal coloration and turgor, no rashes  ED Course  Procedures (including critical care time) Labs Review Labs Reviewed  CBC - Abnormal; Notable for the following:    WBC 12.1 (*)    Hemoglobin 9.7 (*)  HCT 31.8 (*)    MCV 71.8 (*)    MCH 21.9 (*)    RDW 17.2 (*)    All other components within normal limits  BASIC METABOLIC PANEL - Abnormal; Notable for the following:    Potassium 3.4 (*)    All other components within normal limits  URINALYSIS, ROUTINE W REFLEX MICROSCOPIC - Abnormal; Notable for the following:    APPearance CLOUDY (*)    Nitrite POSITIVE (*)    Leukocytes, UA SMALL (*)    All other components within normal limits  URINE MICROSCOPIC-ADD ON - Abnormal; Notable for the following:    Squamous Epithelial / LPF  FEW (*)    Bacteria, UA MANY (*)    All other components within normal limits  I-STAT TROPOININ, ED  POC URINE PREG, ED    Imaging Review No results found.   EKG Interpretation   Date/Time:  Friday October 30 2014 20:05:00 EST Ventricular Rate:  90 PR Interval:  142 QRS Duration: 74 QT Interval:  396 QTC Calculation: 484 R Axis:   27 Text Interpretation:  Normal sinus rhythm Nonspecific T wave abnormality  Prolonged QT Abnormal ECG ED PHYSICIAN INTERPRETATION AVAILABLE IN [facility] Confirmed by TEST, Record ([id number]) on 11/01/2014 7:33:12 AM      MDM   Final diagnoses:  UTI (lower urinary tract infection)  Shortness of breath    Pt presenting with both chest tightness c/w mild asthma exacerbation as well as dysuria.  She has evidence of UTI on UA.  No systemic symptoms.  Mild wheezing on exam- feels improved after albuterol inhaler.  Discharged with rx for keflex for UTI.  Discharged with strict return precautions.  Pt agreeable with plan.   Threasa Beards, MD 11/05/14 [phone]

## 2014-11-02 ENCOUNTER — Ambulatory Visit: Payer: Medicaid Other | Admitting: Skilled Nursing Facility1

## 2014-12-07 ENCOUNTER — Ambulatory Visit: Payer: Medicaid Other | Admitting: Skilled Nursing Facility1

## 2015-03-03 ENCOUNTER — Emergency Department (HOSPITAL_COMMUNITY): Payer: Medicaid Other

## 2015-03-03 ENCOUNTER — Emergency Department (HOSPITAL_COMMUNITY)
Admission: EM | Admit: 2015-03-03 | Discharge: 2015-03-03 | Disposition: A | Discharge status: Home / Self Care | Payer: Medicaid Other | Attending: Emergency Medicine | Admitting: Emergency Medicine

## 2015-03-03 ENCOUNTER — Encounter (HOSPITAL_COMMUNITY): Payer: Self-pay | Admitting: Emergency Medicine

## 2015-03-03 DIAGNOSIS — S62395A Other fracture of fourth metacarpal bone, left hand, initial encounter for closed fracture: Secondary | ICD-10-CM | POA: Insufficient documentation

## 2015-03-03 DIAGNOSIS — J45909 Unspecified asthma, uncomplicated: Secondary | ICD-10-CM | POA: Insufficient documentation

## 2015-03-03 DIAGNOSIS — Z872 Personal history of diseases of the skin and subcutaneous tissue: Secondary | ICD-10-CM | POA: Insufficient documentation

## 2015-03-03 DIAGNOSIS — Y929 Unspecified place or not applicable: Secondary | ICD-10-CM | POA: Diagnosis not present

## 2015-03-03 DIAGNOSIS — Z79899 Other long term (current) drug therapy: Secondary | ICD-10-CM | POA: Diagnosis not present

## 2015-03-03 DIAGNOSIS — I10 Essential (primary) hypertension: Secondary | ICD-10-CM | POA: Diagnosis not present

## 2015-03-03 DIAGNOSIS — S6992XA Unspecified injury of left wrist, hand and finger(s), initial encounter: Secondary | ICD-10-CM | POA: Diagnosis present

## 2015-03-03 DIAGNOSIS — E669 Obesity, unspecified: Secondary | ICD-10-CM | POA: Diagnosis not present

## 2015-03-03 DIAGNOSIS — Z792 Long term (current) use of antibiotics: Secondary | ICD-10-CM | POA: Insufficient documentation

## 2015-03-03 DIAGNOSIS — Y999 Unspecified external cause status: Secondary | ICD-10-CM | POA: Diagnosis not present

## 2015-03-03 DIAGNOSIS — W010XXA Fall on same level from slipping, tripping and stumbling without subsequent striking against object, initial encounter: Secondary | ICD-10-CM | POA: Diagnosis not present

## 2015-03-03 DIAGNOSIS — Y939 Activity, unspecified: Secondary | ICD-10-CM | POA: Insufficient documentation

## 2015-03-03 DIAGNOSIS — S62305A Unspecified fracture of fourth metacarpal bone, left hand, initial encounter for closed fracture: Secondary | ICD-10-CM

## 2015-03-03 MED ORDER — HYDROCODONE-ACETAMINOPHEN 5-325 MG PO TABS: 2.0000 | ORAL_TABLET | ORAL | Status: DC | PRN

## 2015-03-03 MED ORDER — HYDROCODONE-ACETAMINOPHEN 5-325 MG PO TABS
2.0000 | ORAL_TABLET | Freq: Once | ORAL | Status: AC
  Administered 2015-03-03: 2 via ORAL
  Filled 2015-03-03: qty 2

## 2015-03-03 NOTE — ED Notes (Signed)
Patient back from x-ray 

## 2015-03-03 NOTE — ED Notes (Signed)
Transported to xray per W/C.

## 2015-03-03 NOTE — ED Provider Notes (Signed)
CSN: [id number]     Arrival date & time 03/03/15  1112 History  This chart was scribed for non-physician practitioner, Alvina Chou, PA-C, working with Daleen Bo, MD by Ladene Artist, ED Scribe. This patient was seen in room TR06C/TR06C and the patient's care was started at 12:48 PM.   Chief Complaint  Patient presents with  . Wrist Injury   The history is provided by the patient. No language interpreter was used.   HPI Comments: Wendy Humphrey is a 29 y.o. female, with a h/o HTN, asthma, morphea, who presents to the Emergency Department complaining of slip and fall that occurred this morning. Pt states that she stretched out her L hand to catch herself. She reports associated constant L wrist pain and swelling. No other alleviating or aggravating factors.  Past Medical History  Diagnosis Date  . Obesity   . Asthma   . Morphea   . Migraine   . Hypertension    History reviewed. No pertinent past surgical history. No family history on file. History  Substance Use Topics  . Smoking status: Never Smoker   . Smokeless tobacco: Not on file  . Alcohol Use: No   OB History    No data available     Review of Systems  Musculoskeletal: Positive for joint swelling and arthralgias.  All other systems reviewed and are negative.  Allergies  Review of patient's allergies indicates no known allergies.  Home Medications   Prior to Admission medications   Medication Sig Start Date End Date Taking? Authorizing Provider  amLODipine (NORVASC) 10 MG tablet Take 10 mg by mouth daily. 09/14/14   Historical Provider, MD  cephALEXin (KEFLEX) 500 MG capsule Take 1 capsule (500 mg total) by mouth 4 (four) times daily. 10/30/14   Alfonzo Beers, MD  Levothyroxine Sodium (SYNTHROID PO) Take 1 tablet by mouth daily.    Historical Provider, MD  lisinopril (PRINIVIL,ZESTRIL) 40 MG tablet Take 40 mg by mouth daily. 09/14/14   Historical Provider, MD  metoprolol (LOPRESSOR) 50 MG tablet Take 50 mg  by mouth 2 (two) times daily. 09/14/14   Historical Provider, MD  PROAIR HFA 108 (90 BASE) MCG/ACT inhaler Inhale 2 puffs into the lungs daily as needed for wheezing or shortness of breath.  10/29/14   Historical Provider, MD   BP 153/110 mmHg  Pulse 101  Temp(Src) 97.9 F (36.6 C) (Oral)  Resp 20  Ht 5\' 4"  (1.626 m)  Wt 320 lb (145.151 kg)  BMI 54.90 kg/m2  SpO2 99%  LMP 02/02/2015 Physical Exam  Constitutional: She is oriented to person, place, and time. She appears well-developed and well-nourished. No distress.  HENT:  Head: Normocephalic and atraumatic.  Eyes: Conjunctivae and EOM are normal.  Neck: Neck supple. No tracheal deviation present.  Cardiovascular: Normal rate.   Pulses:      Radial pulses are 2+ on the left side.  Pulmonary/Chest: Effort normal. No respiratory distress.  Abdominal: Soft. She exhibits no distension. There is no tenderness. There is no rebound.  Musculoskeletal: Normal range of motion.  Tenderness to palpation of L hand along the 4th metacarpal bone. No obvious deformity. Limited ROM of fingers due to pain.   Neurological: She is alert and oriented to person, place, and time.  Finger sensation in L hand intact.  Skin: Skin is warm and dry.  Psychiatric: She has a normal mood and affect. Her behavior is normal.  Nursing note and vitals reviewed.  ED Course  Procedures (including critical care  time) DIAGNOSTIC STUDIES: Oxygen Saturation is 99% on RA, normal by my interpretation.    COORDINATION OF CARE: 12:50 PM-Discussed treatment plan which includes XR and Vicodin with pt at bedside and pt agreed to plan.   Labs Review Labs Reviewed - No data to display  Imaging Review Dg Wrist Complete Left  03/03/2015   CLINICAL DATA:  Fall  EXAM: LEFT WRIST - COMPLETE 3+ VIEW  COMPARISON:  None.  FINDINGS: Oblique fracture fourth metacarpal with mild displacement. No fracture of the wrist. No significant arthropathy.  IMPRESSION: Fracture fourth  metacarpal.   Electronically Signed   By: Franchot Gallo M.D.   On: 03/03/2015 13:08   SPLINT APPLICATION Date/Time: 65:78 PM Authorized by: Alvina Chou Consent: Verbal consent obtained. Risks and benefits: risks, benefits and alternatives were discussed Consent given by: patient Splint applied by: orthopedic technician Location details: left hand Splint type: sugar tong Supplies used: fiberglass Post-procedure: The splinted body part was neurovascularly unchanged following the procedure. Patient tolerance: Patient tolerated the procedure well with no immediate complications.      EKG Interpretation None      MDM   Final diagnoses:  Fracture of fourth metacarpal bone of left hand, closed, initial encounter    12:56 PM Patient's xray shows fracture of 4th metacarpal bone of the left hand. No neurovascular compromise. No other injury. Patient will have ortho follow up and splint.   I personally performed the services described in this documentation, which was scribed in my presence. The recorded information has been reviewed and is accurate.    Alvina Chou, PA-C 03/03/15 [place], MD 03/03/15 [phone]

## 2015-03-03 NOTE — ED Notes (Signed)
BP okay per PA.   Patient to take BP meds when she gets home.

## 2015-03-03 NOTE — Discharge Instructions (Signed)
Keep splint intact. Follow up with Dr. Caralyn Guile for further evaluaiton. Take vicodin as needed for pain.

## 2015-03-03 NOTE — ED Notes (Signed)
Ortho tech notified.  

## 2015-03-03 NOTE — ED Notes (Signed)
Gave pt ice pack and pillow to prop left hand.

## 2015-03-03 NOTE — ED Notes (Signed)
[place] this am on outstretched hand. C/o left wrist pain. Swelling noted.

## 2015-03-03 NOTE — Progress Notes (Signed)
Orthopedic Tech Progress Note Patient Details:  Wendy Humphrey Jul 08, 1986 [id number]  Ortho Devices Type of Ortho Device: Ace wrap, Arm sling, Sugartong splint Ortho Device/Splint Location: lue Ortho Device/Splint Interventions: Application   Kessler Solly 03/03/2015, 1:18 PM

## 2015-05-31 ENCOUNTER — Emergency Department (HOSPITAL_COMMUNITY): Payer: Medicaid Other

## 2015-05-31 ENCOUNTER — Encounter (HOSPITAL_COMMUNITY): Payer: Self-pay | Admitting: Emergency Medicine

## 2015-05-31 ENCOUNTER — Emergency Department (HOSPITAL_COMMUNITY)
Admission: EM | Admit: 2015-05-31 | Discharge: 2015-05-31 | Disposition: A | Discharge status: Home / Self Care | Payer: Medicaid Other | Attending: Emergency Medicine | Admitting: Emergency Medicine

## 2015-05-31 DIAGNOSIS — Z872 Personal history of diseases of the skin and subcutaneous tissue: Secondary | ICD-10-CM | POA: Diagnosis not present

## 2015-05-31 DIAGNOSIS — E669 Obesity, unspecified: Secondary | ICD-10-CM | POA: Insufficient documentation

## 2015-05-31 DIAGNOSIS — I10 Essential (primary) hypertension: Secondary | ICD-10-CM

## 2015-05-31 DIAGNOSIS — G43909 Migraine, unspecified, not intractable, without status migrainosus: Secondary | ICD-10-CM | POA: Diagnosis not present

## 2015-05-31 DIAGNOSIS — J45901 Unspecified asthma with (acute) exacerbation: Secondary | ICD-10-CM | POA: Diagnosis not present

## 2015-05-31 DIAGNOSIS — Z79899 Other long term (current) drug therapy: Secondary | ICD-10-CM | POA: Insufficient documentation

## 2015-05-31 LAB — CBC
HCT: 32.5 % — ABNORMAL LOW (ref 36.0–46.0)
Hemoglobin: 9.9 g/dL — ABNORMAL LOW (ref 12.0–15.0)
MCH: 22 pg — ABNORMAL LOW (ref 26.0–34.0)
MCHC: 30.5 g/dL (ref 30.0–36.0)
MCV: 72.1 fL — AB (ref 78.0–100.0)
PLATELETS: 296 10*3/uL (ref 150–400)
RBC: 4.51 MIL/uL (ref 3.87–5.11)
RDW: 17.6 % — ABNORMAL HIGH (ref 11.5–15.5)
WBC: 12.2 10*3/uL — AB (ref 4.0–10.5)

## 2015-05-31 LAB — I-STAT TROPONIN, ED: TROPONIN I, POC: 0 ng/mL (ref 0.00–0.08)

## 2015-05-31 LAB — BASIC METABOLIC PANEL
ANION GAP: 6 (ref 5–15)
BUN: <5 mg/dL — ABNORMAL LOW (ref 6–20)
CALCIUM: 9 mg/dL (ref 8.9–10.3)
CHLORIDE: 104 mmol/L (ref 101–111)
CO2: 30 mmol/L (ref 22–32)
CREATININE: 0.84 mg/dL (ref 0.44–1.00)
GFR calc Af Amer: >60 mL/min (ref >60)
Glucose, Bld: 100 mg/dL — ABNORMAL HIGH (ref 65–99)
POTASSIUM: 3.2 mmol/L — AB (ref 3.5–5.1)
SODIUM: 140 mmol/L (ref 135–145)

## 2015-05-31 MED ORDER — KETOROLAC TROMETHAMINE 30 MG/ML IJ SOLN
60.0000 mg | Freq: Once | INTRAMUSCULAR | Status: AC
  Administered 2015-05-31: 60 mg via INTRAMUSCULAR
  Filled 2015-05-31: qty 2

## 2015-05-31 MED ORDER — CLONIDINE HCL 0.1 MG PO TABS
0.1000 mg | ORAL_TABLET | Freq: Once | ORAL | Status: AC
  Administered 2015-05-31: 0.1 mg via ORAL
  Filled 2015-05-31: qty 1

## 2015-05-31 NOTE — ED Notes (Signed)
Pt reports she takes 3 different B/P meds  One in AM ,one at lunch and one in evening.

## 2015-05-31 NOTE — ED Notes (Signed)
Patient states history of HTN.  Patient states she "can tell when my pressure is high because I get faintish and I have a headache in my eyes".  Patient states she felt that way this morning and still feels the same.

## 2015-05-31 NOTE — ED Provider Notes (Signed)
CSN: [id number]     Arrival date & time 05/31/15  1500 History  This chart was scribed for non-physician practitioner, Glendell Docker, NP, working with Leonard Schwartz, MD, by Helane Gunther ED Scribe. This patient was seen in room TR08C/TR08C and the patient's care was started at 4:02 PM    Chief Complaint  Patient presents with  . Hypertension  The history is provided by the patient. No language interpreter was used.   HPI Comments: Wendy Humphrey is a 29 y.o. female with a PMHx of HTN who presents to the Emergency Department complaining of hypertension onset this morning. Pt states she knew it from a pulsing headache behind her eyes, which seems to also pulse down her neck, because she experienced similar symptoms in previous episodes of high BP. She used a cuff to record her BP which was 180/120 and would not go down. She is on lisinopril, amlodipine, and metroprolol, all which she is taking regularly. She has had previous episodes of high BP. Her PCP did not have an available appointment today, which is why she came to the ED. She takes her thyroid medication when she wakes up at 6AM every morning. Pt reports associated SOB when walking. She states she has not had to use her inhaler in years, but recently she has had to use it "a lot" for the SOB. Pt denies CP.   Past Medical History  Diagnosis Date  . Obesity   . Asthma   . Morphea   . Migraine   . Hypertension    History reviewed. No pertinent past surgical history. No family history on file. History  Substance Use Topics  . Smoking status: Never Smoker   . Smokeless tobacco: Not on file  . Alcohol Use: No   OB History    No data available     Review of Systems  Respiratory: Positive for shortness of breath.   Cardiovascular: Negative for chest pain.  Neurological: Positive for headaches.  All other systems reviewed and are negative.   Allergies  Review of patient's allergies indicates no known allergies.  Home  Medications   Prior to Admission medications   Medication Sig Start Date End Date Taking? Authorizing Provider  amLODipine (NORVASC) 10 MG tablet Take 10 mg by mouth daily. 09/14/14  Yes Historical Provider, MD  levothyroxine (SYNTHROID, LEVOTHROID) 125 MCG tablet Take 125 mcg by mouth daily. 03/19/15  Yes Historical Provider, MD  lisinopril (PRINIVIL,ZESTRIL) 40 MG tablet Take 40 mg by mouth daily. 09/14/14  Yes Historical Provider, MD  metoprolol (LOPRESSOR) 50 MG tablet Take 50 mg by mouth 2 (two) times daily. 09/14/14  Yes Historical Provider, MD  PROAIR HFA 108 (90 BASE) MCG/ACT inhaler Inhale 2 puffs into the lungs daily as needed for wheezing or shortness of breath.  10/29/14  Yes Historical Provider, MD   BP 154/114 mmHg  Pulse 84  Temp(Src) 98.2 F (36.8 C) (Oral)  Resp 18  SpO2 100%  LMP 05/11/2015 Physical Exam  Constitutional: She is oriented to person, place, and time. She appears well-developed and well-nourished. No distress.  HENT:  Head: Normocephalic and atraumatic.  Right Ear: External ear normal.  Left Ear: External ear normal.  Mouth/Throat: Oropharynx is clear and moist.  Eyes: Conjunctivae and EOM are normal. Pupils are equal, round, and reactive to light.  Neck: Normal range of motion. Neck supple. No tracheal deviation present.  Cardiovascular: Normal rate.   Pulmonary/Chest: Breath sounds normal. No respiratory distress.  Abdominal: Soft.  Musculoskeletal:  Normal range of motion.  Neurological: She is alert and oriented to person, place, and time.  Skin: Skin is warm and dry.  Psychiatric: She has a normal mood and affect. Her behavior is normal.  Nursing note and vitals reviewed.   ED Course  Procedures  DIAGNOSTIC STUDIES: Oxygen Saturation is 98% on RA, normal by my interpretation.    COORDINATION OF CARE: 4:07 PM - Discussed plans to wait on diagnostic studies. Pt advised of plan for treatment and pt agrees.  Labs Review Labs Reviewed  BASIC  METABOLIC PANEL - Abnormal; Notable for the following:    Potassium 3.2 (*)    Glucose, Bld 100 (*)    BUN <5 (*)    All other components within normal limits  CBC - Abnormal; Notable for the following:    WBC 12.2 (*)    Hemoglobin 9.9 (*)    HCT 32.5 (*)    MCV 72.1 (*)    MCH 22.0 (*)    RDW 17.6 (*)    All other components within normal limits  Randolm Idol, ED    Imaging Review Dg Chest 2 View  05/31/2015   CLINICAL DATA:  Headache, hypertension and syncope for 1 day. Initial encounter.  EXAM: CHEST  2 VIEW  COMPARISON:  PA and lateral chest 10/30/2014 and 11/17/2011.  FINDINGS: The lungs are clear. Heart size is normal. No pneumothorax or pleural effusion. No focal bony abnormality.  IMPRESSION: Negative chest.   Electronically Signed   By: Inge Rise M.D.   On: 05/31/2015 16:04   ED ECG REPORT   Date: 05/31/2015  Rate: 87  Rhythm: normal sinus rhythm  QRS Axis: normal  Intervals: normal  ST/T Wave abnormalities: nonspecific T wave changes  Conduction Disutrbances:none  Narrative Interpretation:   Old EKG Reviewed: unchanged  I have personally reviewed the EKG tracing and agree with the computerized printout as noted.   MDM   Final diagnoses:  Essential hypertension    Pt states that she is feeling better with toradol and clonidine. Pt not having cp or sob. Discussed follow up with pcp for return of symptoms to adjust medications as needed  I personally performed the services described in this documentation, which was scribed in my presence. The recorded information has been reviewed and is accurate.   Glendell Docker, NP 05/31/15 [place], MD 06/05/15 2226

## 2015-05-31 NOTE — Discharge Instructions (Signed)
Follow up with your doctor to discussed medication changes as discussed Hypertension Hypertension, commonly called high blood pressure, is when the force of blood pumping through your arteries is too strong. Your arteries are the blood vessels that carry blood from your heart throughout your body. A blood pressure reading consists of a higher number over a lower number, such as 110/72. The higher number (systolic) is the pressure inside your arteries when your heart pumps. The lower number (diastolic) is the pressure inside your arteries when your heart relaxes. Ideally you want your blood pressure below 120/80. Hypertension forces your heart to work harder to pump blood. Your arteries may become narrow or stiff. Having hypertension puts you at risk for heart disease, stroke, and other problems.  RISK FACTORS Some risk factors for high blood pressure are controllable. Others are not.  Risk factors you cannot control include:   Race. You may be at higher risk if you are African American.  Age. Risk increases with age.  Gender. Men are at higher risk than women before age 55 years. After age 66, women are at higher risk than men. Risk factors you can control include:  Not getting enough exercise or physical activity.  Being overweight.  Getting too much fat, sugar, calories, or salt in your diet.  Drinking too much alcohol. SIGNS AND SYMPTOMS Hypertension does not usually cause signs or symptoms. Extremely high blood pressure (hypertensive crisis) may cause headache, anxiety, shortness of breath, and nosebleed. DIAGNOSIS  To check if you have hypertension, your health care provider will measure your blood pressure while you are seated, with your arm held at the level of your heart. It should be measured at least twice using the same arm. Certain conditions can cause a difference in blood pressure between your right and left arms. A blood pressure reading that is higher than normal on one  occasion does not mean that you need treatment. If one blood pressure reading is high, ask your health care provider about having it checked again. TREATMENT  Treating high blood pressure includes making lifestyle changes and possibly taking medicine. Living a healthy lifestyle can help lower high blood pressure. You may need to change some of your habits. Lifestyle changes may include:  Following the DASH diet. This diet is high in fruits, vegetables, and whole grains. It is low in salt, red meat, and added sugars.  Getting at least 2 hours of brisk physical activity every week.  Losing weight if necessary.  Not smoking.  Limiting alcoholic beverages.  Learning ways to reduce stress. If lifestyle changes are not enough to get your blood pressure under control, your health care provider may prescribe medicine. You may need to take more than one. Work closely with your health care provider to understand the risks and benefits. HOME CARE INSTRUCTIONS  Have your blood pressure rechecked as directed by your health care provider.   Take medicines only as directed by your health care provider. Follow the directions carefully. Blood pressure medicines must be taken as prescribed. The medicine does not work as well when you skip doses. Skipping doses also puts you at risk for problems.   Do not smoke.   Monitor your blood pressure at home as directed by your health care provider. SEEK MEDICAL CARE IF:   You think you are having a reaction to medicines taken.  You have recurrent headaches or feel dizzy.  You have swelling in your ankles.  You have trouble with your vision. SEEK IMMEDIATE  MEDICAL CARE IF:  You develop a severe headache or confusion.  You have unusual weakness, numbness, or feel faint.  You have severe chest or abdominal pain.  You vomit repeatedly.  You have trouble breathing. MAKE SURE YOU:   Understand these instructions.  Will watch your  condition.  Will get help right away if you are not doing well or get worse. Document Released: 10/30/2005 Document Revised: 03/16/2014 Document Reviewed: 08/22/2013 [facility] Patient Information 2015 [place], [place]. This information is not intended to replace advice given to you by your health care provider. Make sure you discuss any questions you have with your health care provider.

## 2015-05-31 NOTE — ED Notes (Signed)
Patient called for room x2 

## 2015-05-31 NOTE — ED Notes (Signed)
Patient placed in the room.

## 2015-05-31 NOTE — ED Notes (Signed)
Called Xray. Stated they had the patient. Patient is to be brought to the room when finished.

## 2016-02-15 ENCOUNTER — Emergency Department (HOSPITAL_COMMUNITY)
Admission: EM | Admit: 2016-02-15 | Discharge: 2016-02-15 | Disposition: A | Discharge status: Home / Self Care | Payer: Medicaid Other | Attending: Emergency Medicine | Admitting: Emergency Medicine

## 2016-02-15 ENCOUNTER — Encounter (HOSPITAL_COMMUNITY): Payer: Self-pay

## 2016-02-15 DIAGNOSIS — I10 Essential (primary) hypertension: Secondary | ICD-10-CM | POA: Diagnosis not present

## 2016-02-15 DIAGNOSIS — R51 Headache: Secondary | ICD-10-CM | POA: Diagnosis present

## 2016-02-15 DIAGNOSIS — J45901 Unspecified asthma with (acute) exacerbation: Secondary | ICD-10-CM | POA: Diagnosis not present

## 2016-02-15 DIAGNOSIS — E669 Obesity, unspecified: Secondary | ICD-10-CM | POA: Insufficient documentation

## 2016-02-15 MED ORDER — ALBUTEROL SULFATE (2.5 MG/3ML) 0.083% IN NEBU
INHALATION_SOLUTION | RESPIRATORY_TRACT | Status: AC
  Filled 2016-02-15: qty 6

## 2016-02-15 MED ORDER — ALBUTEROL SULFATE (2.5 MG/3ML) 0.083% IN NEBU
5.0000 mg | INHALATION_SOLUTION | Freq: Once | RESPIRATORY_TRACT | Status: AC
  Administered 2016-02-15: 5 mg via RESPIRATORY_TRACT

## 2016-02-15 NOTE — ED Notes (Signed)
Pt reports onset yesterday productive cough- yellow phlegm, shortness of breath and headache.  Pt is out of asthma meds.  Pt talking in complete sentences.

## 2016-04-05 ENCOUNTER — Emergency Department (HOSPITAL_COMMUNITY)
Admission: EM | Admit: 2016-04-05 | Discharge: 2016-04-05 | Disposition: A | Discharge status: Home / Self Care | Payer: Medicaid Other | Attending: Emergency Medicine | Admitting: Emergency Medicine

## 2016-04-05 ENCOUNTER — Encounter (HOSPITAL_COMMUNITY): Payer: Self-pay | Admitting: Emergency Medicine

## 2016-04-05 DIAGNOSIS — Z79899 Other long term (current) drug therapy: Secondary | ICD-10-CM | POA: Insufficient documentation

## 2016-04-05 DIAGNOSIS — E079 Disorder of thyroid, unspecified: Secondary | ICD-10-CM | POA: Insufficient documentation

## 2016-04-05 DIAGNOSIS — F439 Reaction to severe stress, unspecified: Secondary | ICD-10-CM | POA: Insufficient documentation

## 2016-04-05 DIAGNOSIS — G43909 Migraine, unspecified, not intractable, without status migrainosus: Secondary | ICD-10-CM | POA: Insufficient documentation

## 2016-04-05 DIAGNOSIS — E669 Obesity, unspecified: Secondary | ICD-10-CM | POA: Insufficient documentation

## 2016-04-05 DIAGNOSIS — J45909 Unspecified asthma, uncomplicated: Secondary | ICD-10-CM | POA: Insufficient documentation

## 2016-04-05 DIAGNOSIS — F419 Anxiety disorder, unspecified: Secondary | ICD-10-CM | POA: Diagnosis not present

## 2016-04-05 DIAGNOSIS — Z872 Personal history of diseases of the skin and subcutaneous tissue: Secondary | ICD-10-CM | POA: Diagnosis not present

## 2016-04-05 DIAGNOSIS — I10 Essential (primary) hypertension: Secondary | ICD-10-CM | POA: Diagnosis not present

## 2016-04-05 DIAGNOSIS — F410 Panic disorder [episodic paroxysmal anxiety] without agoraphobia: Secondary | ICD-10-CM

## 2016-04-05 HISTORY — DX: Disorder of thyroid, unspecified: E07.9

## 2016-04-05 HISTORY — DX: Anxiety disorder, unspecified: F41.9

## 2016-04-05 MED ORDER — HYDROXYZINE HCL 25 MG PO TABS: 25.0000 mg | ORAL_TABLET | Freq: Four times a day (QID) | ORAL | Status: DC | PRN

## 2016-04-05 MED ORDER — HYDROXYZINE HCL 25 MG PO TABS
25.0000 mg | ORAL_TABLET | Freq: Once | ORAL | Status: AC
  Administered 2016-04-05: 25 mg via ORAL
  Filled 2016-04-05: qty 1

## 2016-04-05 MED ORDER — LISINOPRIL 20 MG PO TABS
40.0000 mg | ORAL_TABLET | Freq: Once | ORAL | Status: AC
  Administered 2016-04-05: 40 mg via ORAL
  Filled 2016-04-05: qty 2

## 2016-04-05 NOTE — ED Notes (Signed)
Pt says that she has been in a "depression state" says that her anxiety has escalated to having anxiety attacks almost daily, also c/o joint pains, sharp pain in the back of her neck, headache and htn. Hx of thyroid disease and htn. PA at bedside for evaluation.

## 2016-04-05 NOTE — Discharge Instructions (Signed)
Please take your blood pressure as prescribed.  Follow up with your primary care provider to have your thyroid level recheck as it may worsen your anxiety/depression.  Use resources below to find help for your depression.  Take Hydroxyzine as needed for anxiety.  Outpatient Psychiatry and Counseling  [facility]: Mobile Crisis Management 24 hours:  [phone]  [facility] of the [employer] sliding scale fee and walk in schedule: M-F 8am-12pm/1pm-3pm [place], [place] [address] [place], [place] [address] [phone]  [facility] (Formerly known as [facility] [employer])- new patient walk-in appointments available Monday - Friday 8am -3pm.          [address] [place], [place] [address] [phone] or crisis line- [facility]/ Intensive Outpatient Therapy Program [place], [place] [address] [place]      [phone] [address] [place], [place] [address]                 [place]   [facility] [phone]. [place], [place] [address]   [employer] of Care          [address] [place] [address] Johnette Abraham  [place], [place] [address]       [phone]  [facility] [address] [place], [place] [address] [phone]  [facility]    [address] [place], [place] [address]     [place], [place] Joycelyn Man     [place] [address]     [phone]       [facility] [place] [address]  [facility]     [address], Sarah Ann, MD [place], [place] [address] Man     [address]     [place], [place] [address]     [phone]       [facility] [address] [place], [place] [address] [phone] Resources for Temporary Residential Assistance/Crisis [facility]) M-F 8am-3pm   [address], [place] [address]   [phone] Services include: laundry, barbering, support groups, case management, phone  & computer access, showers, AA/NA mtgs, mental health/substance abuse nurse, job skills class, disability information, VA assistance, spiritual classes, etc.   [facility]   [address], [place]     [place]              [employer] (women and children)       [place]. [place], [place] [address] [phone] [email or web address] for application and process Application Required  [facility]   [address]    [place]  [address]     [phone]  [place], [place] [address] [id number] application appt.) Application Required  [facility] (women only)    [address]     [place], [place] [address]     [phone]      Intake starts 6pm daily Need valid ID, SSC, & Police report [employer] [address] [place], [place] [id number] Application Required  [employer] (men only)     [place].      [place], [place]       [place] (Pregnant women only) [address] [place], [place]  [facility]      [place] [address]      [place], [place] [address]     [phone]             [facility] [address] [place], [place] 90 day  commitment/SA/Application process  [facility](men only)     [address]     [place], [place]       Check-in at [facility] of [facility] [address] [place], [place] [address] [phone] Men/Women/Women and Children must be there by 7 pm  [place], [place]

## 2016-04-05 NOTE — ED Notes (Signed)
Patient states HTN problems, and anxiety attacks that have gotten progressively worse.   Patient states she gets a sharp pain in her R neck when she has anxiety attacks.  Patient states her priimary doctor is aware, but states "they don't do anything for them.  I went to [facility] on my own and they didn't do anything".  Patient states she came to ED for "anything that can help".  Denies other symptoms at this time.

## 2016-04-05 NOTE — ED Provider Notes (Signed)
CSN: [id number]     Arrival date & time 04/05/16  1448 History   First MD Initiated Contact with Patient 04/05/16 1855     Chief Complaint  Patient presents with  . Hypertension  . anxiety attacks      (Consider location/radiation/quality/duration/timing/severity/associated sxs/prior Treatment) HPI   30 year old female with history of thyroid disease, obesity, anxiety, hypertension presenting with complaint of anxiety and hypertension. Patient states that she has been feeling depressed ongoing for more than a year. She also has daily anxiety attack which has increased in frequency. Today she also endorsed a throbbing headache, pain to the back of her neck, diffuse joint pain and increased blood pressure. She attributed her symptoms to increase weight gain, and having stress dealing with both family and personal. She also admits that she did not take her blood pressure medication today. She had not been taking her thyroid medication for 3-4 weeks but resumed taking it several days ago. She denies any homicidal or suicidal ideation and denies self medicating with alcohol or street drugs. No complaints of fever, URI symptoms, active chest pain, heart palpitation, abdominal cramping, nausea vomiting diarrhea, focal numbness or weakness, or rash. Denies lightheadedness, dizziness, or diaphoresis. Patient states she has communicate with her doctor about her anxiety but felt that they attributed to her thyroid disease. She did try to find a psychiatrist on her own to seek for additional help. She is here requesting for additional support. Patient however states that her bouts of anxiety and panic attack has since subsided while waiting the ED.  Past Medical History  Diagnosis Date  . Obesity   . Asthma   . Morphea   . Migraine   . Hypertension   . Anxiety   . Thyroid disease    History reviewed. No pertinent past surgical history. No family history on file. Social History  Substance Use Topics   . Smoking status: Never Smoker   . Smokeless tobacco: None  . Alcohol Use: No   OB History    No data available     Review of Systems  All other systems reviewed and are negative.     Allergies  Review of patient's allergies indicates no known allergies.  Home Medications   Prior to Admission medications   Medication Sig Start Date End Date Taking? Authorizing Provider  amLODipine (NORVASC) 10 MG tablet Take 10 mg by mouth daily. 09/14/14   Historical Provider, MD  levothyroxine (SYNTHROID, LEVOTHROID) 125 MCG tablet Take 125 mcg by mouth daily. 03/19/15   Historical Provider, MD  lisinopril (PRINIVIL,ZESTRIL) 40 MG tablet Take 40 mg by mouth daily. 09/14/14   Historical Provider, MD  metoprolol (LOPRESSOR) 50 MG tablet Take 50 mg by mouth 2 (two) times daily. 09/14/14   Historical Provider, MD  PROAIR HFA 108 (90 BASE) MCG/ACT inhaler Inhale 2 puffs into the lungs daily as needed for wheezing or shortness of breath.  10/29/14   Historical Provider, MD   BP 171/121 mmHg  Pulse 94  Temp(Src) 98.2 F (36.8 C) (Oral)  Resp 16  SpO2 95% Physical Exam  Constitutional: She is oriented to person, place, and time. She appears well-developed and well-nourished. No distress.  Mobility obese African-American female nontoxic in appearance.  HENT:  Head: Atraumatic.  Eyes: Conjunctivae and EOM are normal. Pupils are equal, round, and reactive to light.  Neck: Neck supple. No JVD present. No tracheal deviation present. No thyromegaly present.  No nuchal rigidity  Cardiovascular: Normal rate, regular rhythm and intact  distal pulses.   Pulmonary/Chest: Effort normal and breath sounds normal.  Abdominal: Soft.  Musculoskeletal: She exhibits no edema.  Lymphadenopathy:    She has no cervical adenopathy.  Neurological: She is alert and oriented to person, place, and time.  Skin: No rash noted.  Psychiatric: She has a normal mood and affect. Her speech is normal and behavior is normal.  Judgment and thought content normal. Cognition and memory are normal.  Nursing note and vitals reviewed.   ED Course  Procedures (including critical care time)   MDM   Final diagnoses:  Anxiety attack  Essential hypertension    BP 163/97 mmHg  Pulse 94  Temp(Src) 98.2 F (36.8 C) (Oral)  Resp 18  SpO2 98%   7:29 PM Patient here with recurrent anxiety and panic attack which has been ongoing for more than a year worsening due to increasing stress. No suicidal or homicidal ideation. No active panic attack at this time. She is also concerning of high blood pressure. Initial blood pressure was 184/134. It has since improved to 163/97 without any specific treatment. She did not take her blood pressure medication today therefore the dose of lisinopril hydrochlorothiazide was given in the ED. Hydroxyzine given for anxiety to use as needed. She has history of thyroid disease which I think may be a contributor to her anxiety. I encouraged patient to have had checked and to take her thyroid medication as described since patient has missed several weeks of her medicine. At this time at do not think patient has any acute concerning emergency. Patient stable for discharge. Return precaution discussed. Patient voiced understanding and agrees with plan.  Domenic Moras, PA-C 04/05/16 [place], MD 04/07/16 [phone]

## 2016-05-02 ENCOUNTER — Inpatient Hospital Stay (HOSPITAL_COMMUNITY)
Admission: AD | Admit: 2016-05-02 | Discharge: 2016-05-02 | Disposition: A | Discharge status: Home / Self Care | Payer: Medicaid Other | Source: Ambulatory Visit | Attending: Family Medicine | Admitting: Family Medicine

## 2016-05-02 DIAGNOSIS — I10 Essential (primary) hypertension: Secondary | ICD-10-CM | POA: Diagnosis not present

## 2016-05-02 DIAGNOSIS — N390 Urinary tract infection, site not specified: Secondary | ICD-10-CM

## 2016-05-02 DIAGNOSIS — R109 Unspecified abdominal pain: Secondary | ICD-10-CM | POA: Diagnosis present

## 2016-05-02 DIAGNOSIS — F419 Anxiety disorder, unspecified: Secondary | ICD-10-CM | POA: Diagnosis not present

## 2016-05-02 DIAGNOSIS — J45909 Unspecified asthma, uncomplicated: Secondary | ICD-10-CM | POA: Diagnosis not present

## 2016-05-02 DIAGNOSIS — E669 Obesity, unspecified: Secondary | ICD-10-CM | POA: Diagnosis not present

## 2016-05-02 DIAGNOSIS — G43909 Migraine, unspecified, not intractable, without status migrainosus: Secondary | ICD-10-CM | POA: Diagnosis not present

## 2016-05-02 DIAGNOSIS — E079 Disorder of thyroid, unspecified: Secondary | ICD-10-CM | POA: Diagnosis not present

## 2016-05-02 DIAGNOSIS — Z3202 Encounter for pregnancy test, result negative: Secondary | ICD-10-CM

## 2016-05-02 DIAGNOSIS — R319 Hematuria, unspecified: Secondary | ICD-10-CM | POA: Diagnosis present

## 2016-05-02 DIAGNOSIS — N3001 Acute cystitis with hematuria: Secondary | ICD-10-CM

## 2016-05-02 LAB — URINE MICROSCOPIC-ADD ON: RBC / HPF: NONE SEEN RBC/hpf (ref 0–5)

## 2016-05-02 LAB — URINALYSIS, ROUTINE W REFLEX MICROSCOPIC
Bilirubin Urine: NEGATIVE
GLUCOSE, UA: NEGATIVE mg/dL
Hgb urine dipstick: NEGATIVE
KETONES UR: 15 mg/dL — AB
NITRITE: POSITIVE — AB
PROTEIN: 30 mg/dL — AB
Specific Gravity, Urine: 1.02 (ref 1.005–1.030)
pH: 6 (ref 5.0–8.0)

## 2016-05-02 LAB — POCT PREGNANCY, URINE: PREG TEST UR: NEGATIVE

## 2016-05-02 MED ORDER — CIPROFLOXACIN HCL 250 MG PO TABS: 250.0000 mg | ORAL_TABLET | Freq: Two times a day (BID) | ORAL | Status: DC

## 2016-05-02 NOTE — Discharge Instructions (Signed)
Urinary Tract Infection Urinary tract infections (UTIs) can develop anywhere along your urinary tract. Your urinary tract is your body's drainage system for removing wastes and extra water. Your urinary tract includes two kidneys, two ureters, a bladder, and a urethra. Your kidneys are a pair of bean-shaped organs. Each kidney is about the size of your fist. They are located below your ribs, one on each side of your spine. CAUSES Infections are caused by microbes, which are microscopic organisms, including fungi, viruses, and bacteria. These organisms are so small that they can only be seen through a microscope. Bacteria are the microbes that most commonly cause UTIs. SYMPTOMS  Symptoms of UTIs may vary by age and gender of the patient and by the location of the infection. Symptoms in young women typically include a frequent and intense urge to urinate and a painful, burning feeling in the bladder or urethra during urination. Older women and men are more likely to be tired, shaky, and weak and have muscle aches and abdominal pain. A fever may mean the infection is in your kidneys. Other symptoms of a kidney infection include pain in your back or sides below the ribs, nausea, and vomiting. DIAGNOSIS To diagnose a UTI, your caregiver will ask you about your symptoms. Your caregiver will also ask you to provide a urine sample. The urine sample will be tested for bacteria and white blood cells. White blood cells are made by your body to help fight infection. TREATMENT  Typically, UTIs can be treated with medication. Because most UTIs are caused by a bacterial infection, they usually can be treated with the use of antibiotics. The choice of antibiotic and length of treatment depend on your symptoms and the type of bacteria causing your infection. HOME CARE INSTRUCTIONS  If you were prescribed antibiotics, take them exactly as your caregiver instructs you. Finish the medication even if you feel better after  you have only taken some of the medication.  Drink enough water and fluids to keep your urine clear or pale yellow.  Avoid caffeine, tea, and carbonated beverages. They tend to irritate your bladder.  Empty your bladder often. Avoid holding urine for long periods of time.  Empty your bladder before and after sexual intercourse.  After a bowel movement, women should cleanse from front to back. Use each tissue only once. SEEK MEDICAL CARE IF:   You have back pain.  You develop a fever.  Your symptoms do not begin to resolve within 3 days. SEEK IMMEDIATE MEDICAL CARE IF:   You have severe back pain or lower abdominal pain.  You develop chills.  You have nausea or vomiting.  You have continued burning or discomfort with urination. MAKE SURE YOU:   Understand these instructions.  Will watch your condition.  Will get help right away if you are not doing well or get worse.   This information is not intended to replace advice given to you by your health care provider. Make sure you discuss any questions you have with your health care provider.   Document Released: 08/09/2005 Document Revised: 07/21/2015 Document Reviewed: 12/08/2011 Elsevier Interactive Patient Education 2016 [employer]. Hypertension Hypertension, commonly called high blood pressure, is when the force of blood pumping through your arteries is too strong. Your arteries are the blood vessels that carry blood from your heart throughout your body. A blood pressure reading consists of a higher number over a lower number, such as 110/72. The higher number (systolic) is the pressure inside your arteries  when your heart pumps. The lower number (diastolic) is the pressure inside your arteries when your heart relaxes. Ideally you want your blood pressure below 120/80. Hypertension forces your heart to work harder to pump blood. Your arteries may become narrow or stiff. Having untreated or uncontrolled hypertension can cause  heart attack, stroke, kidney disease, and other problems. RISK FACTORS Some risk factors for high blood pressure are controllable. Others are not.  Risk factors you cannot control include:   Race. You may be at higher risk if you are African American.  Age. Risk increases with age.  Gender. Men are at higher risk than women before age 45 years. After age 78, women are at higher risk than men. Risk factors you can control include:  Not getting enough exercise or physical activity.  Being overweight.  Getting too much fat, sugar, calories, or salt in your diet.  Drinking too much alcohol. SIGNS AND SYMPTOMS Hypertension does not usually cause signs or symptoms. Extremely high blood pressure (hypertensive crisis) may cause headache, anxiety, shortness of breath, and nosebleed. DIAGNOSIS To check if you have hypertension, your health care provider will measure your blood pressure while you are seated, with your arm held at the level of your heart. It should be measured at least twice using the same arm. Certain conditions can cause a difference in blood pressure between your right and left arms. A blood pressure reading that is higher than normal on one occasion does not mean that you need treatment. If it is not clear whether you have high blood pressure, you may be asked to return on a different day to have your blood pressure checked again. Or, you may be asked to monitor your blood pressure at home for 1 or more weeks. TREATMENT Treating high blood pressure includes making lifestyle changes and possibly taking medicine. Living a healthy lifestyle can help lower high blood pressure. You may need to change some of your habits. Lifestyle changes may include:  Following the DASH diet. This diet is high in fruits, vegetables, and whole grains. It is low in salt, red meat, and added sugars.  Keep your sodium intake below 2,300 mg per day.  Getting at least 30-45 minutes of aerobic exercise  at least 4 times per week.  Losing weight if necessary.  Not smoking.  Limiting alcoholic beverages.  Learning ways to reduce stress. Your health care provider may prescribe medicine if lifestyle changes are not enough to get your blood pressure under control, and if one of the following is true:  You are 69-27 years of age and your systolic blood pressure is above 140.  You are 12 years of age or older, and your systolic blood pressure is above 150.  Your diastolic blood pressure is above 90.  You have diabetes, and your systolic blood pressure is over [id number] or your diastolic blood pressure is over 90.  You have kidney disease and your blood pressure is above 140/90.  You have heart disease and your blood pressure is above 140/90. Your personal target blood pressure may vary depending on your medical conditions, your age, and other factors. HOME CARE INSTRUCTIONS  Have your blood pressure rechecked as directed by your health care provider.   Take medicines only as directed by your health care provider. Follow the directions carefully. Blood pressure medicines must be taken as prescribed. The medicine does not work as well when you skip doses. Skipping doses also puts you at risk for problems.  Do not  smoke.   Monitor your blood pressure at home as directed by your health care provider. SEEK MEDICAL CARE IF:   You think you are having a reaction to medicines taken.  You have recurrent headaches or feel dizzy.  You have swelling in your ankles.  You have trouble with your vision. SEEK IMMEDIATE MEDICAL CARE IF:  You develop a severe headache or confusion.  You have unusual weakness, numbness, or feel faint.  You have severe chest or abdominal pain.  You vomit repeatedly.  You have trouble breathing. MAKE SURE YOU:   Understand these instructions.  Will watch your condition.  Will get help right away if you are not doing well or get worse.   This information  is not intended to replace advice given to you by your health care provider. Make sure you discuss any questions you have with your health care provider.   Document Released: 10/30/2005 Document Revised: 03/16/2015 Document Reviewed: 08/22/2013 Elsevier Interactive Patient Education [employer].

## 2016-05-02 NOTE — MAU Provider Note (Signed)
History     CSN: [id number]  Arrival date and time: 05/02/16 [id number]   First Provider Initiated Contact with Patient 05/02/16 2044      Chief Complaint  Patient presents with  . Abdominal Cramping  . Hematuria   HPI  Ms. Wendy Humphrey is a 30 y.o. who presents to MAU today with complaint of pelvic cramping today and hematuria. The patient also states that she wants to make sure she is not pregnant. She denies vaginal discharge,flank pain or fever. She has CHTN and is on Lisinopril-HCTZ which she did not take today. She tried to be seen at PCP but was unable to get in today. She denies headache, vision changes, chest pain or change in LE edema.    OB History    No data available      Past Medical History  Diagnosis Date  . Obesity   . Asthma   . Morphea   . Migraine   . Hypertension   . Anxiety   . Thyroid disease     No past surgical history on file.  No family history on file.  Social History  Substance Use Topics  . Smoking status: Never Smoker   . Smokeless tobacco: Not on file  . Alcohol Use: No    Allergies: No Known Allergies  No prescriptions prior to admission    Review of Systems  Constitutional: Negative for fever and malaise/fatigue.  Eyes: Negative for blurred vision.  Cardiovascular: Positive for leg swelling. Negative for chest pain.  Gastrointestinal: Positive for abdominal pain. Negative for nausea, vomiting, diarrhea and constipation.  Genitourinary: Positive for hematuria. Negative for dysuria, urgency, frequency and flank pain.       Neg - vaginal bleeding, discharge  Neurological: Negative for headaches.   Physical Exam   Blood pressure 167/129, pulse 94, temperature 99.4 F (37.4 C), temperature source Oral, resp. rate 20, height 5\' 4"  (1.626 m), weight 356 lb 9.6 oz (161.753 kg), last menstrual period 03/27/2016, SpO2 100 %.  Physical Exam  Nursing note and vitals reviewed. Constitutional: She is oriented to person, place, and  time. She appears well-developed and well-nourished. No distress.  Morbidly obese  HENT:  Head: Normocephalic and atraumatic.  Cardiovascular: Normal rate, regular rhythm and normal heart sounds.   Respiratory: Effort normal and breath sounds normal. No respiratory distress. She has no wheezes. She has no rales.  GI: Soft. She exhibits no distension.  Musculoskeletal: She exhibits edema (mild non-pitting equal bilaterally).  Neurological: She is alert and oriented to person, place, and time.  Skin: Skin is warm and dry. No erythema.  Psychiatric: She has a normal mood and affect.   Results for orders placed or performed during the hospital encounter of 05/02/16 (from the past 24 hour(s))  Urinalysis, Routine w reflex microscopic (not at [facility])     Status: Abnormal   Collection Time: 05/02/16  7:53 PM  Result Value Ref Range   Color, Urine YELLOW YELLOW   APPearance CLEAR CLEAR   Specific Gravity, Urine 1.020 1.005 - 1.030   pH 6.0 5.0 - 8.0   Glucose, UA NEGATIVE NEGATIVE mg/dL   Hgb urine dipstick NEGATIVE NEGATIVE   Bilirubin Urine NEGATIVE NEGATIVE   Ketones, ur 15 (A) NEGATIVE mg/dL   Protein, ur 30 (A) NEGATIVE mg/dL   Nitrite POSITIVE (A) NEGATIVE   Leukocytes, UA SMALL (A) NEGATIVE  Urine microscopic-add on     Status: Abnormal   Collection Time: 05/02/16  7:53 PM  Result Value  Ref Range   Squamous Epithelial / LPF 6-30 (A) NONE SEEN   WBC, UA 0-5 0 - 5 WBC/hpf   RBC / HPF NONE SEEN 0 - 5 RBC/hpf   Bacteria, UA MANY (A) NONE SEEN   Urine-Other MUCOUS PRESENT   Pregnancy, urine POC     Status: None   Collection Time: 05/02/16  8:23 PM  Result Value Ref Range   Preg Test, Ur NEGATIVE NEGATIVE    MAU Course  Procedures None  MDM UPT - negative UA today shows UTI Discussed with EDP and Dr. Kennon Rounds regarding patient presentation and BP. Ok for discharge at this time as she is asymptomatic and has appropriate follow-up.  Assessment and Plan  A: UTI CHTN,  uncontrolled Negative pregnancy test  P: Discharge home Rx for Cipro given to patient  Warning signs for HTN and CVA precautions discussed Patient advised to follow-up with [facility] tomorrow for HTN management Patient may return to MAU as needed or if her condition were to change or worsen  Luvenia Redden, PA-C  05/02/2016, 9:06 PM

## 2016-05-02 NOTE — MAU Note (Signed)
Pt c/o vaginal spotting that started 2 weeks ago and lower abdominal cramping-rates 8/10 that started today. States that she took Tylenol this morning-helped but now pain is back. LMP: 03/27/2016

## 2016-12-10 ENCOUNTER — Emergency Department (HOSPITAL_COMMUNITY): Payer: Medicaid Other

## 2016-12-10 ENCOUNTER — Emergency Department (HOSPITAL_COMMUNITY)
Admission: EM | Admit: 2016-12-10 | Discharge: 2016-12-10 | Disposition: A | Discharge status: Home / Self Care | Payer: Medicaid Other | Attending: Emergency Medicine | Admitting: Emergency Medicine

## 2016-12-10 ENCOUNTER — Encounter (HOSPITAL_COMMUNITY): Payer: Self-pay | Admitting: Emergency Medicine

## 2016-12-10 DIAGNOSIS — Z7982 Long term (current) use of aspirin: Secondary | ICD-10-CM | POA: Diagnosis not present

## 2016-12-10 DIAGNOSIS — R072 Precordial pain: Secondary | ICD-10-CM | POA: Insufficient documentation

## 2016-12-10 DIAGNOSIS — R079 Chest pain, unspecified: Secondary | ICD-10-CM

## 2016-12-10 DIAGNOSIS — J45909 Unspecified asthma, uncomplicated: Secondary | ICD-10-CM | POA: Insufficient documentation

## 2016-12-10 DIAGNOSIS — I10 Essential (primary) hypertension: Secondary | ICD-10-CM | POA: Diagnosis not present

## 2016-12-10 LAB — I-STAT BETA HCG BLOOD, ED (MC, WL, AP ONLY): I-stat hCG, quantitative: <5 m[IU]/mL (ref <5)

## 2016-12-10 LAB — CBC
HCT: 31.5 % — ABNORMAL LOW (ref 36.0–46.0)
Hemoglobin: 9.4 g/dL — ABNORMAL LOW (ref 12.0–15.0)
MCH: 21.2 pg — AB (ref 26.0–34.0)
MCHC: 29.8 g/dL — AB (ref 30.0–36.0)
MCV: 71.1 fL — AB (ref 78.0–100.0)
PLATELETS: 284 10*3/uL (ref 150–400)
RBC: 4.43 MIL/uL (ref 3.87–5.11)
RDW: 18.4 % — ABNORMAL HIGH (ref 11.5–15.5)
WBC: 9.8 10*3/uL (ref 4.0–10.5)

## 2016-12-10 LAB — BASIC METABOLIC PANEL
Anion gap: 6 (ref 5–15)
BUN: 6 mg/dL (ref 6–20)
CALCIUM: 9 mg/dL (ref 8.9–10.3)
CHLORIDE: 107 mmol/L (ref 101–111)
CO2: 28 mmol/L (ref 22–32)
CREATININE: 0.74 mg/dL (ref 0.44–1.00)
GFR calc Af Amer: >60 mL/min (ref >60)
GFR calc non Af Amer: >60 mL/min (ref >60)
GLUCOSE: 89 mg/dL (ref 65–99)
Potassium: 3.4 mmol/L — ABNORMAL LOW (ref 3.5–5.1)
Sodium: 141 mmol/L (ref 135–145)

## 2016-12-10 LAB — I-STAT TROPONIN, ED: TROPONIN I, POC: 0 ng/mL (ref 0.00–0.08)

## 2016-12-10 MED ORDER — LISINOPRIL-HYDROCHLOROTHIAZIDE 20-25 MG PO TABS: 1.0000 | ORAL_TABLET | Freq: Every evening | ORAL | Status: DC

## 2016-12-10 NOTE — ED Triage Notes (Signed)
Pt sts mid sternal CP with SOB x 2 weeks; pt denies cough

## 2016-12-10 NOTE — Discharge Instructions (Signed)
As discussed, please follow up with PCP regarding EKG tomorrow.  Your are leaving before your workup is complete and against medical advised. The risks of leaving include death. Please return to the Emergency department if experiencing any new or concerning symptoms.

## 2016-12-11 NOTE — ED Provider Notes (Signed)
[facility] DEPT Provider Note   CSN: [id number] Arrival date & time: 12/10/16  1519     History   Chief Complaint Chief Complaint  Patient presents with  . Chest Pain    HPI Wendy Humphrey is a 31 y.o. female presenting with intermittent sharp substernal pain that comes on suddenly. The pain radiates into the right side of her neck and right arm. She has not identified triggers. She reports associated symptoms of SOB and diaphoresis. She has also experienced alopecia and eye pressure. She explains that she has hypothyroidism and ran out of medications for 3 weeks before she began to experience these symptoms 2 weeks ago. She was seen for same complaint at the time and was prescribed her levothyroxine, which she just started taking again this week.  HPI  Past Medical History:  Diagnosis Date  . Anxiety   . Asthma   . Hypertension   . Migraine   . Morphea   . Obesity   . Thyroid disease     There are no active problems to display for this patient.   History reviewed. No pertinent surgical history.  OB History    No data available       Home Medications    Prior to Admission medications   Medication Sig Start Date End Date Taking? Authorizing Provider  aspirin-acetaminophen-caffeine (EXCEDRIN MIGRAINE) [phone] MG tablet Take 1 tablet by mouth every 6 (six) hours as needed for headache or migraine.    Historical Provider, MD  ciprofloxacin (CIPRO) 250 MG tablet Take 1 tablet (250 mg total) by mouth every 12 (twelve) hours. 05/02/16   Luvenia Redden, PA-C  hydrOXYzine (ATARAX/VISTARIL) 25 MG tablet Take 1 tablet (25 mg total) by mouth every 6 (six) hours as needed for anxiety. 04/05/16   Domenic Moras, PA-C  levothyroxine (SYNTHROID, LEVOTHROID) 125 MCG tablet Take 125 mcg by mouth daily. 03/19/15   Historical Provider, MD  lisinopril-hydrochlorothiazide (PRINZIDE,ZESTORETIC) 20-25 MG tablet Take 1 tablet by mouth every evening. 03/29/16   Historical Provider, MD  PROAIR  HFA 108 (90 BASE) MCG/ACT inhaler Inhale 2 puffs into the lungs daily as needed for wheezing or shortness of breath.  10/29/14   Historical Provider, MD    Family History History reviewed. No pertinent family history.  Social History Social History  Substance Use Topics  . Smoking status: Never Smoker  . Smokeless tobacco: Not on file  . Alcohol use No     Allergies   Patient has no known allergies.   Review of Systems Review of Systems  Constitutional: Negative for chills and fever.  HENT: Negative for congestion, ear pain and sore throat.   Eyes: Positive for visual disturbance. Negative for pain, discharge and redness.       No pain, just fullness. Reports blurry vision at times  Respiratory: Positive for shortness of breath. Negative for cough, chest tightness, wheezing and stridor.        Not experiencing sxs at this time  Cardiovascular: Positive for chest pain. Negative for leg swelling.       Not experiencing sxs at this time  Gastrointestinal: Positive for nausea and vomiting. Negative for abdominal pain.       Not experiencing sxs at this time  Genitourinary: Negative for dysuria, flank pain and hematuria.  Musculoskeletal: Negative for gait problem, neck pain and neck stiffness.  Skin: Negative for color change, pallor and rash.  Neurological: Negative for dizziness, seizures, syncope, speech difficulty, weakness, light-headedness and headaches.  Psychiatric/Behavioral:  Negative for behavioral problems.     Physical Exam Updated Vital Signs BP (!) 172/124   Pulse 86   Temp 98.6 F (37 C) (Oral)   Resp 18   LMP 11/07/2016 Comment: verified BEFORE imaging  SpO2 100%   Physical Exam  Constitutional: She appears well-developed and well-nourished. No distress.  Afebrile, non-toxic appearing sitting comfortable in chair in no acute distress.  HENT:  Head: Normocephalic.  Eyes: Conjunctivae and EOM are normal. Pupils are equal, round, and reactive to light.  Right eye exhibits no discharge. Left eye exhibits no discharge. No scleral icterus.  Red reflex normal bilaterally, unable to visualize disc margin clearly as patient kept moving her eyes and blinking.   Neck: Normal range of motion. Neck supple. No JVD present. No tracheal deviation present.  Cardiovascular: Normal rate, regular rhythm, normal heart sounds and intact distal pulses.   Pulmonary/Chest: Effort normal and breath sounds normal. No stridor. No respiratory distress. She has no wheezes. She has no rales. She exhibits no tenderness.  Abdominal: Soft. She exhibits no distension. There is no tenderness.  Musculoskeletal: She exhibits no edema or deformity.  Neurological: She is alert.  Skin: Skin is warm and dry. She is not diaphoretic. No pallor.  Psychiatric: She has a normal mood and affect. Her behavior is normal.  Nursing note and vitals reviewed.    ED Treatments / Results  Labs (all labs ordered are listed, but only abnormal results are displayed) Labs Reviewed  BASIC METABOLIC PANEL - Abnormal; Notable for the following:       Result Value   Potassium 3.4 (*)    All other components within normal limits  CBC - Abnormal; Notable for the following:    Hemoglobin 9.4 (*)    HCT 31.5 (*)    MCV 71.1 (*)    MCH 21.2 (*)    MCHC 29.8 (*)    RDW 18.4 (*)    All other components within normal limits  I-STAT TROPOININ, ED  I-STAT BETA HCG BLOOD, ED ([facility], [facility], [facility] ONLY)    EKG  EKG Interpretation None       Radiology Dg Chest 2 View  Result Date: 12/10/2016 CLINICAL DATA:  Chest pain for 2 weeks. EXAM: CHEST  2 VIEW COMPARISON:  05/31/2015 and 11/17/2011 radiographs FINDINGS: Cardiomegaly identified. Mild peribronchial thickening is unchanged. There is no evidence of focal airspace disease, pulmonary edema, suspicious pulmonary nodule/mass, pleural effusion, or pneumothorax. No acute bony abnormalities are identified. IMPRESSION: Cardiomegaly without evidence of acute  cardiopulmonary disease. Electronically Signed   By: Margarette Canada M.D.   On: 12/10/2016 16:31    Procedures Procedures (including critical care time)  Medications Ordered in ED Medications - No data to display   Initial Impression / Assessment and Plan / ED Course  I have reviewed the triage vital signs and the nursing notes.  Pertinent labs & imaging results that were available during my care of the patient were reviewed by me and considered in my medical decision making (see chart for details).     31 y/o female presenting with intermittent sharp chest pains, with associated sob. CXR showing cardiomegally without evidence of acute cardiopulmonary disease. EKG NSR with slightly prolonged QT Initial troponin : 0  Discussed elevated blood pressure with patient. She stated that it was because she had not been compliant with her medications today. Ordered home meds to be administered while in Ed. She stated that she had them in her bag and will take  it. Provided patient with primary care resources to establish care in the area.  Patient decided to leave AMA after discussing most of her results and prior to workup being completed.  We discussed the nature and purpose, risks and benefits, as well as, the alternatives of treatment. Time was given to allow the opportunity to ask questions and consider their options, and after the discussion, the patient decided to refuse the offerred treatment. The patient was informed that refusal could lead to, but was not limited to, death, permanent disability, or severe pain. Prior to refusing, I determined that the patient had the capacity to make their decision and understood the consequences of that decision. After refusal, I made every reasonable opportunity to treat them to the best of my ability.  The patient was notified that they may return to the emergency department at any time for further treatment.    Final Clinical Impressions(s) / ED Diagnoses    Final diagnoses:  Chest pain, unspecified type    New Prescriptions Discharge Medication List as of 12/10/2016  8:34 PM       Emeline General, PA-C 12/13/16 [employer], MD 12/14/16 1013

## 2018-11-16 ENCOUNTER — Encounter (HOSPITAL_COMMUNITY): Payer: Self-pay | Admitting: Emergency Medicine

## 2018-11-16 ENCOUNTER — Emergency Department (HOSPITAL_COMMUNITY)
Admission: EM | Admit: 2018-11-16 | Discharge: 2018-11-16 | Disposition: A | Discharge status: Home / Self Care | Payer: Medicaid - Out of State | Attending: Emergency Medicine | Admitting: Emergency Medicine

## 2018-11-16 DIAGNOSIS — M79601 Pain in right arm: Secondary | ICD-10-CM | POA: Diagnosis present

## 2018-11-16 DIAGNOSIS — J45909 Unspecified asthma, uncomplicated: Secondary | ICD-10-CM | POA: Insufficient documentation

## 2018-11-16 DIAGNOSIS — I10 Essential (primary) hypertension: Secondary | ICD-10-CM | POA: Insufficient documentation

## 2018-11-16 DIAGNOSIS — R239 Unspecified skin changes: Secondary | ICD-10-CM | POA: Diagnosis not present

## 2018-11-16 DIAGNOSIS — Z79899 Other long term (current) drug therapy: Secondary | ICD-10-CM | POA: Insufficient documentation

## 2018-11-16 DIAGNOSIS — Z7982 Long term (current) use of aspirin: Secondary | ICD-10-CM | POA: Insufficient documentation

## 2018-11-16 DIAGNOSIS — E079 Disorder of thyroid, unspecified: Secondary | ICD-10-CM | POA: Diagnosis not present

## 2018-11-16 DIAGNOSIS — Z760 Encounter for issue of repeat prescription: Secondary | ICD-10-CM | POA: Diagnosis not present

## 2018-11-16 DIAGNOSIS — D492 Neoplasm of unspecified behavior of bone, soft tissue, and skin: Secondary | ICD-10-CM

## 2018-11-16 MED ORDER — AMLODIPINE BESYLATE 5 MG PO TABS
10.0000 mg | ORAL_TABLET | Freq: Once | ORAL | Status: AC
  Administered 2018-11-16: 10 mg via ORAL
  Filled 2018-11-16: qty 2

## 2018-11-16 MED ORDER — AMLODIPINE BESYLATE 10 MG PO TABS: 10.0000 mg | ORAL_TABLET | Freq: Every day | ORAL | 0 refills | Status: DC

## 2018-11-16 MED ORDER — HYDROCHLOROTHIAZIDE 25 MG PO TABS
25.0000 mg | ORAL_TABLET | Freq: Every day | ORAL | Status: DC
  Administered 2018-11-16: 25 mg via ORAL
  Filled 2018-11-16: qty 1

## 2018-11-16 MED ORDER — HYDROCHLOROTHIAZIDE 25 MG PO TABS: 25.0000 mg | ORAL_TABLET | Freq: Every day | ORAL | 0 refills | Status: DC

## 2018-11-16 NOTE — ED Provider Notes (Signed)
[facility] EMERGENCY DEPARTMENT Provider Note   CSN: [id number] Arrival date & time: 11/16/18  1325     History   Chief Complaint Chief Complaint  Patient presents with  . Arm Pain    HPI ENEZ MONAHAN is a 33 y.o. female.   33yo female presents with complaint of fleshy growth on her right second finger, present for several months, painful- throbbing in nature, radiates up her arm and makes her feel nauseated. Patient tried to cut the growth off once previously, it bled a lot and later re-grew. No injuries to the finger.  Patient was hypertensive in triage, states she is been out of her blood pressure medications for several weeks.  Denies chest pain, shortness of breath, headache, changes in vision.     Past Medical History:  Diagnosis Date  . Anxiety   . Asthma   . Hypertension   . Migraine   . Morphea   . Obesity   . Thyroid disease     There are no active problems to display for this patient.   History reviewed. No pertinent surgical history.   OB History   No obstetric history on file.      Home Medications    Prior to Admission medications   Medication Sig Start Date End Date Taking? Authorizing Provider  amLODipine (NORVASC) 10 MG tablet Take 1 tablet (10 mg total) by mouth daily for 21 days. 11/16/18 12/07/18  Tacy Learn, PA-C  aspirin-acetaminophen-caffeine (EXCEDRIN MIGRAINE) [id number] MG tablet Take 1 tablet by mouth every 6 (six) hours as needed for headache or migraine.    [provider]  ciprofloxacin (CIPRO) 250 MG tablet Take 1 tablet (250 mg total) by mouth every 12 (twelve) hours. 05/02/16   Luvenia Redden, PA-C  hydrochlorothiazide (HYDRODIURIL) 25 MG tablet Take 1 tablet (25 mg total) by mouth daily for 21 days. 11/16/18 12/07/18  Tacy Learn, PA-C  hydrOXYzine (ATARAX/VISTARIL) 25 MG tablet Take 1 tablet (25 mg total) by mouth every 6 (six) hours as needed for anxiety. 04/05/16   Domenic Moras, PA-C    levothyroxine (SYNTHROID, LEVOTHROID) 125 MCG tablet Take 125 mcg by mouth daily. 03/19/15   [provider]  lisinopril-hydrochlorothiazide (PRINZIDE,ZESTORETIC) 20-25 MG tablet Take 1 tablet by mouth every evening. 03/29/16   [provider]  PROAIR HFA 108 (90 BASE) MCG/ACT inhaler Inhale 2 puffs into the lungs daily as needed for wheezing or shortness of breath.  10/29/14   [provider]    Family History History reviewed. No pertinent family history.  Social History Social History   Tobacco Use  . Smoking status: Never Smoker  . Smokeless tobacco: Never Used  Substance Use Topics  . Alcohol use: No  . Drug use: No     Allergies   Patient has no known allergies.   Review of Systems Review of Systems  Constitutional: Negative for fever.  Eyes: Negative for visual disturbance.  Cardiovascular: Negative for chest pain.  Musculoskeletal: Positive for arthralgias and myalgias.  Skin: Negative for color change, rash and wound.  Allergic/Immunologic: Negative for immunocompromised state.  Neurological: Negative for headaches.  Hematological: Negative for adenopathy. Does not bruise/bleed easily.  Psychiatric/Behavioral: Negative for confusion.  All other systems reviewed and are negative.    Physical Exam Updated Vital Signs BP (!) 171/112 (BP Location: Left Wrist)   Pulse 93   Temp 98.1 F (36.7 C) (Oral)   Resp 18   Ht 5\' 4"  (1.626 m)  Wt 113.4 kg   LMP 10/28/2018   SpO2 98%   BMI 42.91 kg/m   Physical Exam Vitals signs and nursing note reviewed.  Constitutional:      General: She is not in acute distress.    Appearance: Normal appearance. She is well-developed. She is obese. She is not diaphoretic.  HENT:     Head: Normocephalic and atraumatic.  Cardiovascular:     Rate and Rhythm: Normal rate and regular rhythm.     Pulses: Normal pulses.     Heart sounds: Normal heart sounds. No murmur.  Pulmonary:     Effort: Pulmonary  effort is normal.     Breath sounds: Normal breath sounds.  Musculoskeletal:        General: Tenderness present. No signs of injury.       Hands:  Skin:    General: Skin is warm and dry.     Capillary Refill: Capillary refill takes less than 2 seconds.     Findings: No erythema or rash.  Neurological:     Mental Status: She is alert and oriented to person, place, and time.  Psychiatric:        Behavior: Behavior normal.      ED Treatments / Results  Labs (all labs ordered are listed, but only abnormal results are displayed) Labs Reviewed - No data to display  EKG None  Radiology No results found.  Procedures Procedures (including critical care time)  Medications Ordered in ED Medications  hydrochlorothiazide (HYDRODIURIL) tablet 25 mg (25 mg Oral Given 11/16/18 1717)  amLODipine (NORVASC) tablet 10 mg (10 mg Oral Given 11/16/18 1716)     Initial Impression / Assessment and Plan / ED Course  I have reviewed the triage vital signs and the nursing notes.  Pertinent labs & imaging results that were available during my care of the patient were reviewed by me and considered in my medical decision making (see chart for details).  Clinical Course as of Nov 16 1745  Sat Nov 16, 2018  1624 Medications verified through patient's [facility] pharmacy. HCTZ 25mg  QD Amlodipine 10mg  QD Losartan 50mg  QD   [LM]  5424 33 year old female with complaint of fleshy growth on her right second finger.  On exam patient has a approximately 3 mm soft tissue slightly tender growth extending from the radial aspect of her right second finger DIP, no active bleeding, no evidence of secondary infection.  Differential diagnosis includes but not limited to keratoacanthoma versus pyogenic granuloma versus SCC.  Recommend patient follow-up with dermatology or general surgery for further evaluation and removal of the area.  Patient was given medication for her blood pressure prior to discharge as well as a  refill of her Norvasc and hydrochlorothiazide.  Refer to [facility] for primary care follow-up and further management of her blood pressure.   [LM]    Clinical Course User Index [LM] Tacy Learn, PA-C   Final Clinical Impressions(s) / ED Diagnoses   Final diagnoses:  Abnormal skin growth  Medication refill    ED Discharge Orders         Ordered    hydrochlorothiazide (HYDRODIURIL) 25 MG tablet  Daily     11/16/18 1646    amLODipine (NORVASC) 10 MG tablet  Daily     11/16/18 1646           Tacy Learn, PA-C 11/16/18 1747    Drenda Freeze, MD 11/16/18 2200

## 2018-11-16 NOTE — Discharge Instructions (Signed)
Take medications as prescribed.  Referral given for [facility] for primary care follow-up and further medication management. Follow-up with general surgery, referral given for skin growth removal.

## 2018-11-16 NOTE — ED Triage Notes (Signed)
Pt states she started having pain in her right arm yesterday. Pt complains of nausea as well. Pt has a small growth on her right pointer finger that is causing a pounding pain. Pt has hx of htn and takes medication. Pt's BP remains high. Denies CP/SOB.

## 2019-03-05 ENCOUNTER — Emergency Department (HOSPITAL_COMMUNITY): Payer: Medicaid Other

## 2019-03-05 ENCOUNTER — Encounter (HOSPITAL_COMMUNITY): Payer: Self-pay | Admitting: Emergency Medicine

## 2019-03-05 ENCOUNTER — Other Ambulatory Visit: Payer: Self-pay

## 2019-03-05 ENCOUNTER — Emergency Department (HOSPITAL_COMMUNITY)
Admission: EM | Admit: 2019-03-05 | Discharge: 2019-03-06 | Disposition: A | Discharge status: Home / Self Care | Payer: Medicaid Other | Attending: Emergency Medicine | Admitting: Emergency Medicine

## 2019-03-05 DIAGNOSIS — R0789 Other chest pain: Secondary | ICD-10-CM | POA: Diagnosis present

## 2019-03-05 DIAGNOSIS — K0889 Other specified disorders of teeth and supporting structures: Secondary | ICD-10-CM | POA: Diagnosis not present

## 2019-03-05 DIAGNOSIS — E079 Disorder of thyroid, unspecified: Secondary | ICD-10-CM | POA: Insufficient documentation

## 2019-03-05 DIAGNOSIS — I10 Essential (primary) hypertension: Secondary | ICD-10-CM

## 2019-03-05 DIAGNOSIS — E669 Obesity, unspecified: Secondary | ICD-10-CM | POA: Insufficient documentation

## 2019-03-05 DIAGNOSIS — R51 Headache: Secondary | ICD-10-CM | POA: Diagnosis not present

## 2019-03-05 DIAGNOSIS — R519 Headache, unspecified: Secondary | ICD-10-CM

## 2019-03-05 DIAGNOSIS — Z6841 Body Mass Index (BMI) 40.0 and over, adult: Secondary | ICD-10-CM | POA: Insufficient documentation

## 2019-03-05 LAB — I-STAT BETA HCG BLOOD, ED (MC, WL, AP ONLY): I-stat hCG, quantitative: <5 m[IU]/mL (ref <5)

## 2019-03-05 LAB — CBC
HCT: 33.4 % — ABNORMAL LOW (ref 36.0–46.0)
Hemoglobin: 9.9 g/dL — ABNORMAL LOW (ref 12.0–15.0)
MCH: 21.2 pg — ABNORMAL LOW (ref 26.0–34.0)
MCHC: 29.6 g/dL — ABNORMAL LOW (ref 30.0–36.0)
MCV: 71.7 fL — ABNORMAL LOW (ref 80.0–100.0)
Platelets: 284 10*3/uL (ref 150–400)
RBC: 4.66 MIL/uL (ref 3.87–5.11)
RDW: 18.9 % — ABNORMAL HIGH (ref 11.5–15.5)
WBC: 14.3 10*3/uL — ABNORMAL HIGH (ref 4.0–10.5)
nRBC: 0 % (ref 0.0–0.2)

## 2019-03-05 LAB — BASIC METABOLIC PANEL
Anion gap: 11 (ref 5–15)
BUN: 13 mg/dL (ref 6–20)
CO2: 25 mmol/L (ref 22–32)
Calcium: 8.8 mg/dL — ABNORMAL LOW (ref 8.9–10.3)
Chloride: 103 mmol/L (ref 98–111)
Creatinine, Ser: 0.84 mg/dL (ref 0.44–1.00)
GFR calc Af Amer: >60 mL/min (ref >60)
GFR calc non Af Amer: >60 mL/min (ref >60)
Glucose, Bld: 91 mg/dL (ref 70–99)
Potassium: 3.2 mmol/L — ABNORMAL LOW (ref 3.5–5.1)
Sodium: 139 mmol/L (ref 135–145)

## 2019-03-05 LAB — TROPONIN I: Troponin I: <0.03 ng/mL (ref <0.03)

## 2019-03-05 MED ORDER — LOSARTAN POTASSIUM 50 MG PO TABS: 50.0000 mg | ORAL_TABLET | Freq: Every day | ORAL | 1 refills | Status: DC

## 2019-03-05 MED ORDER — PROCHLORPERAZINE EDISYLATE 10 MG/2ML IJ SOLN
10.0000 mg | Freq: Once | INTRAMUSCULAR | Status: AC
  Administered 2019-03-05: 10 mg via INTRAVENOUS
  Filled 2019-03-05: qty 2

## 2019-03-05 MED ORDER — KETOROLAC TROMETHAMINE 30 MG/ML IJ SOLN
30.0000 mg | Freq: Once | INTRAMUSCULAR | Status: AC
  Administered 2019-03-05: 23:00:00 30 mg via INTRAVENOUS
  Filled 2019-03-05: qty 1

## 2019-03-05 MED ORDER — SODIUM CHLORIDE 0.9 % IV BOLUS
500.0000 mL | Freq: Once | INTRAVENOUS | Status: AC
  Administered 2019-03-05: 500 mL via INTRAVENOUS

## 2019-03-05 MED ORDER — AMLODIPINE BESYLATE 10 MG PO TABS: 10.0000 mg | ORAL_TABLET | Freq: Every day | ORAL | 1 refills | Status: DC

## 2019-03-05 MED ORDER — LEVOTHYROXINE SODIUM 125 MCG PO TABS: 125.0000 ug | ORAL_TABLET | Freq: Every day | ORAL | 0 refills | Status: DC

## 2019-03-05 MED ORDER — HYDROCHLOROTHIAZIDE 25 MG PO TABS: 25.0000 mg | ORAL_TABLET | Freq: Every day | ORAL | 1 refills | Status: DC

## 2019-03-05 MED ORDER — DIPHENHYDRAMINE HCL 50 MG/ML IJ SOLN
25.0000 mg | Freq: Once | INTRAMUSCULAR | Status: AC
  Administered 2019-03-05: 25 mg via INTRAVENOUS
  Filled 2019-03-05: qty 1

## 2019-03-05 MED ORDER — SODIUM CHLORIDE 0.9% FLUSH
3.0000 mL | Freq: Once | INTRAVENOUS | Status: AC
  Administered 2019-03-05: 3 mL via INTRAVENOUS

## 2019-03-05 NOTE — ED Provider Notes (Signed)
[facility] EMERGENC[facility] DEPARTMENT Provider Note   CSN: [id number] Arrival date & time: 03/05/19  1949    History   Chief Complaint Chief Complaint  Patient presents with  . Chest Pain  . Headache    HPI Wendy Humphrey is a 33 y.o. female with a history of asthma, HTN, anxiety, migraine, obesity, and thyroid disease who presents the emergency department with a chief complaint of chest pain.  The patient reports that she has been having intermittent episodes of sharp central chest pain for the last 2 days.  Pain will last for 1 to 2 minutes before resolving spontaneously.  She does not have any factors that bring on the chest pain or cause it to resolve.  No known aggravating or alleviating factors.  She reports that sometimes she will rub her chest and that seems to help.  She also reports that she developed a right-sided headache earlier today.  She reports that she has had headaches in the past secondary to her blood pressure being elevated.  She also reports that she has had similar chest pain in the past when her blood pressures been elevated.  She reports that she has been out of all of her home medications for the last 1 to 2 months including losartan, HCTZ, amlodipine, and levothyroxine.  Reports that she has been out of her home medications because she recently moved to the area and had to obtain Medicaid in [place], which occurred around the time that COVID-19 pandemic started.  She denies visual changes, photophobia, nausea, vomiting, shortness of breath, cough, fever, chills, numbness, weakness, URI symptoms.  She does report she is having some right-sided dental pain, but has an appointment with her dentist tomorrow morning.  No facial or neck swelling, trismus, drooling, muffled voice.  No treatment prior to arrival.     The history is provided by the patient. No language interpreter was used.    Past Medical History:  Diagnosis Date  . Anxiety    . Asthma   . Hypertension   . Migraine   . Morphea   . Obesity   . Thyroid disease     There are no active problems to display for this patient.   History reviewed. No pertinent surgical history.   OB History   No obstetric history on file.      Home Medications    Prior to Admission medications   Medication Sig Start Date End Date Taking? Authorizing Provider  amLODipine (NORVASC) 10 MG tablet Take 1 tablet (10 mg total) by mouth daily for 30 days. 03/05/19 04/04/19  Pheobe Sandiford A, PA-C  aspirin-acetaminophen-caffeine (EXCEDRIN MIGRAINE) [phone] MG tablet Take 1 tablet by mouth every 6 (six) hours as needed for headache or migraine.    [provider]  ciprofloxacin (CIPRO) 250 MG tablet Take 1 tablet (250 mg total) by mouth every 12 (twelve) hours. 05/02/16   Luvenia Redden, PA-C  hydrochlorothiazide (HYDRODIURIL) 25 MG tablet Take 1 tablet (25 mg total) by mouth daily for 30 days. 03/05/19 04/04/19  Quinnton Bury A, PA-C  hydrOXYzine (ATARAX/VISTARIL) 25 MG tablet Take 1 tablet (25 mg total) by mouth every 6 (six) hours as needed for anxiety. 04/05/16   Domenic Moras, PA-C  levothyroxine (SYNTHROID) 125 MCG tablet Take 1 tablet (125 mcg total) by mouth daily for 30 days. 03/05/19 04/04/19  Soraida Vickers A, PA-C  losartan (COZAAR) 50 MG tablet Take 1 tablet (50 mg total) by mouth daily for 30  days. 03/05/19 04/04/19  Joline Maxcy A, PA-C  PROAIR HFA 108 (90 BASE) MCG/ACT inhaler Inhale 2 puffs into the lungs daily as needed for wheezing or shortness of breath.  10/29/14   [provider]    Family History No family history on file.  Social History Social History   Tobacco Use  . Smoking status: Never Smoker  . Smokeless tobacco: Never Used  Substance Use Topics  . Alcohol use: No  . Drug use: No     Allergies   Patient has no known allergies.   Review of Systems Review of Systems  Constitutional: Negative for activity change, chills and fever.   Eyes: Negative for visual disturbance.  Respiratory: Negative for apnea, cough, shortness of breath and wheezing.   Cardiovascular: Positive for chest pain. Negative for palpitations and leg swelling.  Gastrointestinal: Negative for abdominal pain, blood in stool, diarrhea, nausea and vomiting.  Genitourinary: Negative for dysuria, frequency, urgency, vaginal bleeding, vaginal discharge and vaginal pain.  Musculoskeletal: Negative for back pain, myalgias, neck pain and neck stiffness.  Skin: Negative for rash and wound.  Allergic/Immunologic: Negative for immunocompromised state.  Neurological: Positive for headaches. Negative for dizziness, seizures, syncope, weakness and numbness.  Psychiatric/Behavioral: Negative for confusion.     Physical Exam Updated Vital Signs BP (!) 168/116   Pulse 93   Temp 98.1 F (36.7 C) (Oral)   Resp 16   Ht 5\' 4"  (1.626 m)   Wt 113.4 kg   LMP 02/18/2019   SpO2 100%   BMI 42.91 kg/m   Physical Exam Vitals signs and nursing note reviewed.  Constitutional:      General: She is not in acute distress.    Appearance: She is obese.  HENT:     Head: Normocephalic.     Mouth/Throat:     Lips: Pink.     Mouth: Mucous membranes are moist.     Dentition: Abnormal dentition. No dental abscesses.     Pharynx: Oropharynx is clear. Uvula midline. No pharyngeal swelling, oropharyngeal exudate, posterior oropharyngeal erythema or uvula swelling.     Tonsils: No tonsillar exudate or tonsillar abscesses.  Eyes:     General: No scleral icterus.       Right eye: No discharge.        Left eye: No discharge.     Extraocular Movements: Extraocular movements intact.     Conjunctiva/sclera: Conjunctivae normal.     Pupils: Pupils are equal, round, and reactive to light.  Neck:     Musculoskeletal: Normal range of motion and neck supple. No neck rigidity or muscular tenderness.     Vascular: No carotid bruit.     Comments: No meningismus Cardiovascular:      Rate and Rhythm: Normal rate and regular rhythm.     Heart sounds: No murmur. No friction rub. No gallop.   Pulmonary:     Effort: Pulmonary effort is normal. No respiratory distress.     Breath sounds: No stridor. No wheezing, rhonchi or rales.  Chest:     Chest wall: No tenderness.  Abdominal:     General: There is no distension.     Palpations: Abdomen is soft.  Lymphadenopathy:     Cervical: No cervical adenopathy.  Skin:    General: Skin is warm.     Findings: No rash.  Neurological:     General: No focal deficit present.     Mental Status: She is alert and oriented to person, place, and time. Mental  status is at baseline.     Cranial Nerves: No cranial nerve deficit.     Sensory: No sensory deficit.     Motor: No weakness.     Coordination: Coordination normal.     Gait: Gait normal.     Deep Tendon Reflexes: Reflexes normal.  Psychiatric:        Behavior: Behavior normal.      ED Treatments / Results  Labs (all labs ordered are listed, but only abnormal results are displayed) Labs Reviewed  BASIC METABOLIC PANEL - Abnormal; Notable for the following components:      Result Value   Potassium 3.2 (*)    Calcium 8.8 (*)    All other components within normal limits  CBC - Abnormal; Notable for the following components:   WBC 14.3 (*)    Hemoglobin 9.9 (*)    HCT 33.4 (*)    MCV 71.7 (*)    MCH 21.2 (*)    MCHC 29.6 (*)    RDW 18.9 (*)    All other components within normal limits  TROPONIN I  I-STAT BETA HCG BLOOD, ED ([facility], [facility], [facility] ONLY)    EKG None  Radiology Dg Chest 2 View  Result Date: 03/05/2019 CLINICAL DATA:  Chest pain for 2 days. Hypertension. EXAM: CHEST - 2 VIEW COMPARISON:  12/10/2016. FINDINGS: Cardiomegaly. No consolidation or edema. No effusion or pneumothorax. Bones unremarkable. Similar appearance to priors. IMPRESSION: Cardiomegaly. No active disease. Electronically Signed   By: Staci Righter M.D.   On: 03/05/2019 21:11    Procedures  Procedures (including critical care time)  Medications Ordered in ED Medications  sodium chloride flush (NS) 0.9 % injection 3 mL (3 mLs Intravenous Given 03/05/19 2321)  sodium chloride 0.9 % bolus 500 mL (500 mLs Intravenous New Bag/Given 03/05/19 2321)  ketorolac (TORADOL) 30 MG/ML injection 30 mg (30 mg Intravenous Given 03/05/19 2319)  prochlorperazine (COMPAZINE) injection 10 mg (10 mg Intravenous Given 03/05/19 2320)  diphenhydrAMINE (BENADRYL) injection 25 mg (25 mg Intravenous Given 03/05/19 2319)     Initial Impression / Assessment and Plan / ED Course  I have reviewed the triage vital signs and the nursing notes.  Pertinent labs & imaging results that were available during my care of the patient were reviewed by me and considered in my medical decision making (see chart for details).        33 year old female with a history of asthma, HTN, anxiety, migraine, obesity presenting with intermittent chest pain for the last 2 days and a right-sided headache, onset this morning.  Reports she has had similar symptoms previously when her blood pressure has been elevated.  She has been out of her home medications for the last 2 months.  She has no neurologic deficits on exam.  Blood pressure is 168/116 on my exam.  Vitals are otherwise reassuring.  Troponin is normal.  EKG is unchanged.  She has a leukocytosis of 14, but this could be either reactive or secondary to developing dental infection.  She has appointment with her dentist tomorrow and I do not see a dental abscess on exam so I will defer treatment at this time.  She has no neurologic deficits.  Low suspicion for hypertensive urgency or emergency.  I will provide the patient with a refill for all of her home medications.  She was offered a dose of these medications tonight in the ER, but declined.  She was given a migraine cocktail and on reevaluation headache had resolved.  She reports that she was feeling much better.  Low suspicion for  PE, ACS, asthma exacerbation, pneumothorax, pneumonia, aortic dissection.  She is hemodynamically stable and in no acute distress.  Safe discharge home with outpatient follow-up at this time.  Final Clinical Impressions(s) / ED Diagnoses   Final diagnoses:  Hypertension, unspecified type  Bad headache    ED Discharge Orders         Ordered    levothyroxine (SYNTHROID) 125 MCG tablet  Daily     03/05/19 2343    losartan (COZAAR) 50 MG tablet  Daily     03/05/19 2343    hydrochlorothiazide (HYDRODIURIL) 25 MG tablet  Daily     03/05/19 2343    amLODipine (NORVASC) 10 MG tablet  Daily     03/05/19 2343           Junelle Hashemi, Laymond Purser, PA-C 03/06/19 0003    Virgel Manifold, MD 03/07/19 2023

## 2019-03-05 NOTE — ED Triage Notes (Signed)
C/o headache and pain to center of chest x 2 days.  No neuro deficits noted on triage exam.  Denies nausea, vomiting, and SOB.  Also reports toothache- scheduled to see dentist tomorrow.

## 2019-03-05 NOTE — Discharge Instructions (Addendum)
Thank you for allowing me to care for you today in the Emergency Department.   Take 1 tablet of amlodipine, HCTZ, levothyroxine, and losartan daily.  If you do not have a primary care provider, call the number on your discharge paperwork to get established with a new primary care provider to ensure that you have refills of your medication.  Return to the emergency department if your blood pressure is high and you develop a severe headache with vomiting, visual changes, new numbness or weakness, severe chest pain or shortness of breath, vomiting, pain going up her neck or down her arm, or other new, concerning symptoms.

## 2019-03-05 NOTE — ED Notes (Signed)
Updated on wait for treatment room. 

## 2019-05-30 ENCOUNTER — Encounter (HOSPITAL_COMMUNITY): Payer: Self-pay | Admitting: Emergency Medicine

## 2019-05-30 ENCOUNTER — Other Ambulatory Visit: Payer: Self-pay

## 2019-05-30 ENCOUNTER — Ambulatory Visit (HOSPITAL_COMMUNITY)
Admission: EM | Admit: 2019-05-30 | Discharge: 2019-05-30 | Disposition: A | Discharge status: Home / Self Care | Payer: Medicaid Other | Attending: Urgent Care | Admitting: Urgent Care

## 2019-05-30 DIAGNOSIS — K047 Periapical abscess without sinus: Secondary | ICD-10-CM

## 2019-05-30 DIAGNOSIS — I160 Hypertensive urgency: Secondary | ICD-10-CM

## 2019-05-30 DIAGNOSIS — K0889 Other specified disorders of teeth and supporting structures: Secondary | ICD-10-CM

## 2019-05-30 DIAGNOSIS — R030 Elevated blood-pressure reading, without diagnosis of hypertension: Secondary | ICD-10-CM

## 2019-05-30 DIAGNOSIS — I10 Essential (primary) hypertension: Secondary | ICD-10-CM

## 2019-05-30 MED ORDER — TRAMADOL HCL 50 MG PO TABS: 50.0000 mg | ORAL_TABLET | Freq: Four times a day (QID) | ORAL | 0 refills | Status: DC | PRN

## 2019-05-30 MED ORDER — AMOXICILLIN-POT CLAVULANATE 875-125 MG PO TABS: 1.0000 | ORAL_TABLET | Freq: Two times a day (BID) | ORAL | 0 refills | Status: DC

## 2019-05-30 MED ORDER — LIDOCAINE VISCOUS HCL 2 % MT SOLN: 15.0000 mL | OROMUCOSAL | 0 refills | Status: DC | PRN

## 2019-05-30 NOTE — ED Provider Notes (Addendum)
MRN: [id number] DOB: Sep 21, 1986  Subjective:   Wendy Humphrey is a 33 y.o. female presenting for 1 day history of acute onset worsening constant sharp-throbbing left lower jaw and dental pain.  Patient rates pain 10 out of 10.  She does not have a dentist.  Regarding her high blood pressure, patient states that she was only able to pick up her medication today and has not yet taken it.   No current facility-administered medications for this encounter.   Current Outpatient Medications:  .  amLODipine (NORVASC) 10 MG tablet, Take 1 tablet (10 mg total) by mouth daily for 30 days., Disp: 30 tablet, Rfl: 1 .  aspirin-acetaminophen-caffeine (EXCEDRIN MIGRAINE) 250-250-65 MG tablet, Take 1 tablet by mouth every 6 (six) hours as needed for headache or migraine., Disp: , Rfl:  .  ciprofloxacin (CIPRO) 250 MG tablet, Take 1 tablet (250 mg total) by mouth every 12 (twelve) hours., Disp: 10 tablet, Rfl: 0 .  hydrochlorothiazide (HYDRODIURIL) 25 MG tablet, Take 1 tablet (25 mg total) by mouth daily for 30 days., Disp: 30 tablet, Rfl: 1 .  hydrOXYzine (ATARAX/VISTARIL) 25 MG tablet, Take 1 tablet (25 mg total) by mouth every 6 (six) hours as needed for anxiety., Disp: 20 tablet, Rfl: 0 .  levothyroxine (SYNTHROID) 125 MCG tablet, Take 1 tablet (125 mcg total) by mouth daily for 30 days., Disp: 30 tablet, Rfl: 0 .  losartan (COZAAR) 50 MG tablet, Take 1 tablet (50 mg total) by mouth daily for 30 days., Disp: 30 tablet, Rfl: 1 .  PROAIR HFA 108 (90 BASE) MCG/ACT inhaler, Inhale 2 puffs into the lungs daily as needed for wheezing or shortness of breath. , Disp: , Rfl: 0    No Known Allergies   Past Medical History:  Diagnosis Date  . Anxiety   . Asthma   . Hypertension   . Migraine   . Morphea   . Obesity   . Thyroid disease      Denies psh.   ROS   Objective:   Vitals: BP (!) 209/138   Pulse 87   Temp 98.2 F (36.8 C)   Resp 16   LMP 05/16/2019   SpO2 100%   Physical Exam  Constitutional:      General: She is in acute distress.     Appearance: Normal appearance. She is well-developed. She is obese. She is not ill-appearing, toxic-appearing or diaphoretic.  HENT:     Head: Normocephalic and atraumatic.     Nose: Nose normal.     Mouth/Throat:     Mouth: Mucous membranes are moist.   Eyes:     Extraocular Movements: Extraocular movements intact.     Pupils: Pupils are equal, round, and reactive to light.  Cardiovascular:     Rate and Rhythm: Normal rate and regular rhythm.     Pulses: Normal pulses.     Heart sounds: Normal heart sounds. No murmur. No friction rub. No gallop.   Pulmonary:     Effort: Pulmonary effort is normal. No respiratory distress.     Breath sounds: Normal breath sounds. No stridor. No wheezing, rhonchi or rales.  Skin:    General: Skin is warm and dry.     Findings: No rash.  Neurological:     General: No focal deficit present.     Mental Status: She is alert and oriented to person, place, and time.     Cranial Nerves: No cranial nerve deficit.     Motor: No weakness.  Coordination: Coordination normal.     Gait: Gait normal.     Deep Tendon Reflexes: Reflexes normal.  Psychiatric:        Mood and Affect: Mood normal.        Behavior: Behavior normal.        Thought Content: Thought content normal.      Assessment and Plan :   1. Dental infection   2. Tooth pain   3. Essential hypertension   4. Elevated blood pressure reading   5. Morbid obesity ([place])   6. Hypertensive urgency     Emphasized need for compliance with her blood pressure medications.  Patient given strict ER precautions.  For her dental pain will use viscous lidocaine, schedule Tylenol and use tramadol for severe pain.  Patient will start Augmentin, set up an office visit with the dental surgeon. Counseled patient on potential for adverse effects with medications prescribed/recommended today, ER and return-to-clinic precautions discussed, patient  verbalized understanding.    Jaynee Eagles, PA-C 05/30/19 1749    Jaynee Eagles, PA-C 05/30/19 1750

## 2019-05-30 NOTE — Discharge Instructions (Addendum)
You may take 500mg  Tylenol every 6 hours for pain and inflammation. Tramadol will be for severe pain.  Your blood pressure is severely elevated and therefore I need you to avoid any over-the-counter nonsteroidal anti-inflammatory (NSAID) including Motrin, ibuprofen, Aleve, naproxen, etc.  Please make sure that you start taking your blood pressure medications.  Set up a follow-up appointment next week with your primary care doctor for recheck on your blood pressure.

## 2019-05-30 NOTE — ED Triage Notes (Signed)
Pt c/o dental pain on the left side starting this morning, states "it feels like its on the bottom". Denies facial swelling.

## 2019-06-09 ENCOUNTER — Telehealth (HOSPITAL_COMMUNITY): Payer: Self-pay | Admitting: Emergency Medicine

## 2019-06-09 MED ORDER — FLUCONAZOLE 150 MG PO TABS: 150.0000 mg | ORAL_TABLET | Freq: Once | ORAL | 0 refills | Status: AC

## 2019-06-09 NOTE — Telephone Encounter (Signed)
Pt called stating the amoxicillin gave her a yeast infection. Okay to send treatment per Traci. NP

## 2019-11-23 ENCOUNTER — Emergency Department (HOSPITAL_COMMUNITY)
Admission: EM | Admit: 2019-11-23 | Discharge: 2019-11-24 | Disposition: A | Discharge status: Home / Self Care | Payer: BLUE CROSS/BLUE SHIELD | Attending: Emergency Medicine | Admitting: Emergency Medicine

## 2019-11-23 ENCOUNTER — Encounter (HOSPITAL_COMMUNITY): Payer: Self-pay | Admitting: Emergency Medicine

## 2019-11-23 ENCOUNTER — Other Ambulatory Visit: Payer: Self-pay

## 2019-11-23 DIAGNOSIS — K0889 Other specified disorders of teeth and supporting structures: Secondary | ICD-10-CM | POA: Diagnosis present

## 2019-11-23 DIAGNOSIS — Z5321 Procedure and treatment not carried out due to patient leaving prior to being seen by health care provider: Secondary | ICD-10-CM | POA: Diagnosis not present

## 2019-11-23 NOTE — ED Notes (Signed)
Pt did not want to wait. LWBS 

## 2019-11-23 NOTE — ED Triage Notes (Signed)
Pt reports left sided dental pain x 2 days, states she is unsure which tooth exactly, does not have a dentist.

## 2020-05-16 ENCOUNTER — Emergency Department (HOSPITAL_COMMUNITY): Payer: Medicaid Other

## 2020-05-16 ENCOUNTER — Encounter (HOSPITAL_COMMUNITY): Payer: Self-pay | Admitting: Emergency Medicine

## 2020-05-16 ENCOUNTER — Other Ambulatory Visit: Payer: Self-pay

## 2020-05-16 ENCOUNTER — Emergency Department (HOSPITAL_COMMUNITY)
Admission: EM | Admit: 2020-05-16 | Discharge: 2020-05-16 | Disposition: A | Discharge status: Home / Self Care | Payer: Medicaid Other | Attending: Emergency Medicine | Admitting: Emergency Medicine

## 2020-05-16 DIAGNOSIS — Z79899 Other long term (current) drug therapy: Secondary | ICD-10-CM | POA: Diagnosis not present

## 2020-05-16 DIAGNOSIS — I10 Essential (primary) hypertension: Secondary | ICD-10-CM

## 2020-05-16 DIAGNOSIS — J45909 Unspecified asthma, uncomplicated: Secondary | ICD-10-CM | POA: Insufficient documentation

## 2020-05-16 DIAGNOSIS — R519 Headache, unspecified: Secondary | ICD-10-CM | POA: Diagnosis present

## 2020-05-16 MED ORDER — AMLODIPINE BESYLATE 5 MG PO TABS
10.0000 mg | ORAL_TABLET | Freq: Every day | ORAL | Status: DC
  Filled 2020-05-16: qty 2

## 2020-05-16 MED ORDER — DIPHENHYDRAMINE HCL 25 MG PO CAPS
25.0000 mg | ORAL_CAPSULE | Freq: Once | ORAL | Status: AC
  Administered 2020-05-16: 25 mg via ORAL
  Filled 2020-05-16: qty 1

## 2020-05-16 MED ORDER — SODIUM CHLORIDE 0.9 % IV BOLUS: 1000.0000 mL | Freq: Once | INTRAVENOUS | Status: DC

## 2020-05-16 MED ORDER — METOPROLOL TARTRATE 5 MG/5ML IV SOLN
5.0000 mg | Freq: Once | INTRAVENOUS | Status: DC
  Filled 2020-05-16: qty 5

## 2020-05-16 MED ORDER — METOCLOPRAMIDE HCL 10 MG PO TABS
10.0000 mg | ORAL_TABLET | Freq: Once | ORAL | Status: AC
  Administered 2020-05-16: 10 mg via ORAL
  Filled 2020-05-16: qty 1

## 2020-05-16 MED ORDER — HYDROCHLOROTHIAZIDE 25 MG PO TABS
25.0000 mg | ORAL_TABLET | Freq: Every day | ORAL | Status: DC
  Administered 2020-05-16: 25 mg via ORAL
  Filled 2020-05-16 (×2): qty 1

## 2020-05-16 MED ORDER — METOCLOPRAMIDE HCL 5 MG/ML IJ SOLN
10.0000 mg | Freq: Once | INTRAMUSCULAR | Status: DC
  Filled 2020-05-16: qty 2

## 2020-05-16 MED ORDER — LOSARTAN POTASSIUM 50 MG PO TABS
50.0000 mg | ORAL_TABLET | Freq: Every day | ORAL | Status: DC
  Filled 2020-05-16: qty 1

## 2020-05-16 MED ORDER — IBUPROFEN 400 MG PO TABS
600.0000 mg | ORAL_TABLET | Freq: Once | ORAL | Status: AC
  Administered 2020-05-16: 600 mg via ORAL
  Filled 2020-05-16: qty 1

## 2020-05-16 NOTE — ED Provider Notes (Signed)
[facility] EMERGENCY DEPARTMENT Provider Note   CSN: [id number] Arrival date & time: 05/16/20  1311     History Chief Complaint  Patient presents with   Headache   Ear Pain    Wendy Humphrey is a 34 y.o. female.  HPI  Patient is a 34 year old female with a history of asthma, anxiety, hypertension, migraines, morbid obesity  Patient presented to ER today for complaints of sharp intermittent pain to the right top of her head that began yesterday.  She also endorses some right ear pain.  She denies any nausea, vomiting, chest pain, shortness of breath, abdominal pain, lightheadedness or dizziness or epistaxis.  She states that she has not taken any of her blood pressure medications today she normally takes 3.  She states that she did not take them because her headache was bothering her and she wanted to be seen in the ER.  Patient states that she took 1 tablet of Tylenol yesterday and states that it did not help.  She has taken no other medications prior to arrival.  Patient denies any trauma to the head.  Denies any history of blood clots and states that she is not currently pregnant.  She states that she normally takes her blood pressure medications.  Patient also endorses some right ear pain.  She states normal hearing no discharge.  No cough or congestion.    Past Medical History:  Diagnosis Date   Anxiety    Asthma    Hypertension    Migraine    Morphea    Obesity    Thyroid disease     There are no problems to display for this patient.   History reviewed. No pertinent surgical history.   OB History   No obstetric history on file.     No family history on file.  Social History   Tobacco Use   Smoking status: Never Smoker   Smokeless tobacco: Never Used  Substance Use Topics   Alcohol use: No   Drug use: No    Home Medications Prior to Admission medications   Medication Sig Start Date End Date Taking? Authorizing Provider    acetaminophen (TYLENOL) 500 MG tablet Take 500 mg by mouth every 6 (six) hours as needed for mild pain, moderate pain or headache.   Yes [provider]  amLODipine (NORVASC) 10 MG tablet Take 1 tablet (10 mg total) by mouth daily for 30 days. 03/05/19 05/16/21 Yes McDonald, Mia A, PA-C  hydrochlorothiazide (HYDRODIURIL) 25 MG tablet Take 1 tablet (25 mg total) by mouth daily for 30 days. 03/05/19 05/16/21 Yes McDonald, Mia A, PA-C  levothyroxine (SYNTHROID) 125 MCG tablet Take 1 tablet (125 mcg total) by mouth daily for 30 days. 03/05/19 05/16/21 Yes McDonald, Mia A, PA-C  losartan (COZAAR) 50 MG tablet Take 1 tablet (50 mg total) by mouth daily for 30 days. 03/05/19 05/16/21 Yes McDonald, Mia A, PA-C  PROAIR HFA 108 (90 BASE) MCG/ACT inhaler Inhale 2 puffs into the lungs daily as needed for wheezing or shortness of breath.  10/29/14  Yes [provider]  Vitamin D, Ergocalciferol, (DRISDOL) 1.25 MG (50000 UNIT) CAPS capsule Take 50,000 Units by mouth once a week. On Mondays 04/13/20  Yes [provider]  amoxicillin-clavulanate (AUGMENTIN) 875-125 MG tablet Take 1 tablet by mouth every 12 (twelve) hours. Patient not taking: Reported on 05/16/2020 05/30/19   Jaynee Eagles, PA-C  hydrOXYzine (ATARAX/VISTARIL) 25 MG tablet Take 1 tablet (25 mg total) by mouth  every 6 (six) hours as needed for anxiety. Patient not taking: Reported on 05/16/2020 04/05/16   Domenic Moras, PA-C  lidocaine (XYLOCAINE) 2 % solution Use as directed 15 mLs in the mouth or throat as needed for mouth pain. Do not swallow. Patient not taking: Reported on 05/16/2020 05/30/19   Jaynee Eagles, PA-C  traMADol (ULTRAM) 50 MG tablet Take 1 tablet (50 mg total) by mouth every 6 (six) hours as needed for severe pain. Patient not taking: Reported on 05/16/2020 05/30/19   Jaynee Eagles, PA-C    Allergies    Patient has no known allergies.  Review of Systems   Review of Systems  Constitutional: Negative for chills and fever.  HENT:  Positive for ear pain. Negative for congestion.   Eyes: Negative for pain.  Respiratory: Negative for cough and shortness of breath.   Cardiovascular: Negative for chest pain and leg swelling.  Gastrointestinal: Negative for abdominal pain and vomiting.  Genitourinary: Negative for dysuria.  Musculoskeletal: Negative for myalgias.  Skin: Negative for rash.  Neurological: Positive for headaches. Negative for dizziness.    Physical Exam Updated Vital Signs BP (!) 180/122 (BP Location: Right Arm)    Pulse (!) 107    Temp 98.2 F (36.8 C) (Oral)    Resp 16    Ht 5\' 4"  (1.626 m)    Wt (!) 145.2 kg    LMP 04/25/2020    SpO2 94%    BMI 54.93 kg/m   Physical Exam Vitals and nursing note reviewed.  Constitutional:      General: She is not in acute distress. HENT:     Head: Normocephalic and atraumatic.     Right Ear: Tympanic membrane, ear canal and external ear normal.     Left Ear: Tympanic membrane, ear canal and external ear normal.     Nose: Nose normal.  Eyes:     General: No scleral icterus. Cardiovascular:     Rate and Rhythm: Normal rate and regular rhythm.     Pulses: Normal pulses.     Heart sounds: Normal heart sounds.  Pulmonary:     Effort: Pulmonary effort is normal. No respiratory distress.     Breath sounds: No wheezing.  Abdominal:     Palpations: Abdomen is soft.     Tenderness: There is no abdominal tenderness. There is no guarding or rebound.  Musculoskeletal:     Cervical back: Normal range of motion and neck supple. No tenderness.     Right lower leg: No edema.     Left lower leg: No edema.  Skin:    General: Skin is warm and dry.     Capillary Refill: Capillary refill takes less than 2 seconds.  Neurological:     Mental Status: She is alert. Mental status is at baseline.     Comments: Alert and oriented to self, place, time and event.   Speech is fluent, clear without dysarthria or dysphasia.   Strength 5/5 in upper/lower extremities  Sensation  intact in upper/lower extremities   Normal gait.  Negative Romberg. No pronator drift.  Normal finger-to-nose and feet tapping.  CN I not tested  CN II grossly intact visual fields bilaterally. Did not visualize posterior eye.   CN III, IV, VI PERRLA and EOMs intact bilaterally  CN V Intact sensation to sharp and light touch to the face  CN VII facial movements symmetric  CN VIII not tested  CN IX, X no uvula deviation, symmetric rise of soft palate  CN XI 5/5 SCM and trapezius strength bilaterally  CN XII Midline tongue protrusion, symmetric L/R movements   Psychiatric:        Mood and Affect: Mood normal.        Behavior: Behavior normal.     ED Results / Procedures / Treatments   Labs (all labs ordered are listed, but only abnormal results are displayed) Labs Reviewed - No data to display  EKG None  Radiology CT Head Wo Contrast  Result Date: 05/16/2020 CLINICAL DATA:  Headache. C/o intermittent sharp pain to top of head since yesterday. Also reports R ear pain. EXAM: CT HEAD WITHOUT CONTRAST TECHNIQUE: Contiguous axial images were obtained from the base of the skull through the vertex without intravenous contrast. COMPARISON:  07/09/2014 FINDINGS: Brain: No evidence of acute infarction, hemorrhage, hydrocephalus, extra-axial collection or mass lesion/mass effect. Vascular: No hyperdense vessel or unexpected calcification. Skull: Normal. Negative for fracture or focal lesion. Sinuses/Orbits: There is mild mucosal thickening of the ethmoid air cells. Other: None IMPRESSION: 1. No evidence for acute intracranial abnormality. 2. Mild ethmoid sinus disease. Electronically Signed   By: Nolon Nations M.D.   On: 05/16/2020 15:39    Procedures Procedures (including critical care time)  Medications Ordered in ED Medications  hydrochlorothiazide (HYDRODIURIL) tablet 25 mg (25 mg Oral Given 05/16/20 1548)  diphenhydrAMINE (BENADRYL) capsule 25 mg (25 mg Oral Given 05/16/20 1548)    metoCLOPramide (REGLAN) tablet 10 mg (10 mg Oral Given 05/16/20 1558)  ibuprofen (ADVIL) tablet 600 mg (600 mg Oral Given 05/16/20 1558)    ED Course  I have reviewed the triage vital signs and the nursing notes.  Pertinent labs & imaging results that were available during my care of the patient were reviewed by me and considered in my medical decision making (see chart for details).  Patient with severely elevated blood pressure had not taken her blood pressure medications today presenting today with headache.  Obtain CT scan of the head to rule out intracranial hemorrhage.  Clinical Course as of May 16 1809  Sun May 16, 2020  1553 Patient reassessed at this time.  States headache is improved somewhat.  Blood pressure is also improved without intervention 158/107.  Will provide patient with her hydrochlorothiazide which is her home medication.  Will provide her with ibuprofen, Benadryl and Reglan orally.  She decision-making conversation held with patient and she states she prefer to hold off on lab work at this time and use p.o. medications.  Head CT is without any intracranial hemorrhage.   [WF]    Clinical Course User Index [WF] Tedd Sias, [place]   MDM Rules/Calculators/A&P                          Patient received Reglan, ibuprofen, Benadryl, HCTZ for her home dose and given 2 glasses of water.  She had blood pressure rechecked which was again found to be elevated however when I went to patient's room to discuss with her she was no longer in the room.  Appears the patient eloped.  Final Clinical Impression(s) / ED Diagnoses Final diagnoses:  Essential hypertension  Bad headache    Rx / DC Orders ED Discharge Orders    None       Tedd Sias, [place] 05/16/20 1810    Sherwood Gambler, MD 05/16/20 2223

## 2020-05-16 NOTE — ED Triage Notes (Addendum)
C/o intermittent sharp pain to top of head since yesterday.  Also reports R ear pain.  No arm drift.  Denies any other symptoms other than pain.  Pt hypertensive. States she normally takes BP medication in the mornings but didn't take it today because her head was hurting so bad.

## 2020-05-16 NOTE — ED Notes (Signed)
Came back to unit after transporting progressive patient upstairs and noticed that pt was no longer in the room. Was not able to review discharge paperwork with the patient or able to get discharge vitals/esignature due to leaving prematurely

## 2020-05-16 NOTE — Discharge Instructions (Signed)
Please follow-up with your primary care doctor for your severely elevated blood pressure.  Please take your blood pressure medications as prescribed.  For your headache please use Tylenol or ibuprofen for pain.  You may use 600 mg ibuprofen every 6 hours or 1000 mg of Tylenol every 6 hours.  You may choose to alternate between the 2.  This would be most effective.  Not to exceed 4 g of Tylenol within 24 hours.  Not to exceed 3200 mg ibuprofen 24 hours.

## 2020-09-11 ENCOUNTER — Encounter (HOSPITAL_COMMUNITY): Payer: Self-pay | Admitting: Emergency Medicine

## 2020-09-11 ENCOUNTER — Other Ambulatory Visit: Payer: Self-pay

## 2020-09-11 ENCOUNTER — Emergency Department (HOSPITAL_COMMUNITY): Payer: Medicaid Other

## 2020-09-11 ENCOUNTER — Emergency Department (HOSPITAL_COMMUNITY)
Admission: EM | Admit: 2020-09-11 | Discharge: 2020-09-11 | Disposition: A | Discharge status: Home / Self Care | Payer: Medicaid Other | Attending: Emergency Medicine | Admitting: Emergency Medicine

## 2020-09-11 DIAGNOSIS — S90212A Contusion of left great toe with damage to nail, initial encounter: Secondary | ICD-10-CM | POA: Diagnosis not present

## 2020-09-11 DIAGNOSIS — Z79899 Other long term (current) drug therapy: Secondary | ICD-10-CM | POA: Insufficient documentation

## 2020-09-11 DIAGNOSIS — Y999 Unspecified external cause status: Secondary | ICD-10-CM | POA: Insufficient documentation

## 2020-09-11 DIAGNOSIS — S90222A Contusion of left lesser toe(s) with damage to nail, initial encounter: Secondary | ICD-10-CM

## 2020-09-11 DIAGNOSIS — Y939 Activity, unspecified: Secondary | ICD-10-CM | POA: Diagnosis not present

## 2020-09-11 DIAGNOSIS — I10 Essential (primary) hypertension: Secondary | ICD-10-CM | POA: Insufficient documentation

## 2020-09-11 DIAGNOSIS — J45909 Unspecified asthma, uncomplicated: Secondary | ICD-10-CM | POA: Insufficient documentation

## 2020-09-11 DIAGNOSIS — W228XXA Striking against or struck by other objects, initial encounter: Secondary | ICD-10-CM | POA: Diagnosis not present

## 2020-09-11 DIAGNOSIS — Y92512 Supermarket, store or market as the place of occurrence of the external cause: Secondary | ICD-10-CM | POA: Diagnosis not present

## 2020-09-11 DIAGNOSIS — S99922A Unspecified injury of left foot, initial encounter: Secondary | ICD-10-CM | POA: Diagnosis present

## 2020-09-11 MED ORDER — AMLODIPINE BESYLATE 5 MG PO TABS
10.0000 mg | ORAL_TABLET | Freq: Once | ORAL | Status: AC
  Administered 2020-09-11: 10 mg via ORAL
  Filled 2020-09-11: qty 2

## 2020-09-11 MED ORDER — LOSARTAN POTASSIUM 25 MG PO TABS
50.0000 mg | ORAL_TABLET | Freq: Once | ORAL | Status: AC
  Administered 2020-09-11: 50 mg via ORAL
  Filled 2020-09-11: qty 2
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
## 2020-09-11 NOTE — Discharge Instructions (Signed)
You have a blood clot underneath the left toenail so I drilled a small hole in the nail.  The nail will grow back on its own.   Follow-up with orthopedic doctor outpatient.  Once the swelling improved in a week or 2, you may get your nails painted again.  See your doctor for follow-up  Please take your blood pressure medicines as prescribed.  Return to ER if you have worse foot pain, chest pain, uncontrolled bleeding

## 2021-05-24 ENCOUNTER — Emergency Department (HOSPITAL_COMMUNITY): Payer: Medicaid Other

## 2021-05-24 ENCOUNTER — Emergency Department (HOSPITAL_COMMUNITY)
Admission: EM | Admit: 2021-05-24 | Discharge: 2021-05-25 | Disposition: A | Discharge status: Home / Self Care | Payer: Medicaid Other | Attending: Emergency Medicine | Admitting: Emergency Medicine

## 2021-05-24 ENCOUNTER — Other Ambulatory Visit: Payer: Self-pay

## 2021-05-24 DIAGNOSIS — Z20822 Contact with and (suspected) exposure to covid-19: Secondary | ICD-10-CM | POA: Insufficient documentation

## 2021-05-24 DIAGNOSIS — R030 Elevated blood-pressure reading, without diagnosis of hypertension: Secondary | ICD-10-CM | POA: Insufficient documentation

## 2021-05-24 DIAGNOSIS — M791 Myalgia, unspecified site: Secondary | ICD-10-CM | POA: Diagnosis not present

## 2021-05-24 DIAGNOSIS — Z5321 Procedure and treatment not carried out due to patient leaving prior to being seen by health care provider: Secondary | ICD-10-CM | POA: Insufficient documentation

## 2021-05-24 DIAGNOSIS — R079 Chest pain, unspecified: Secondary | ICD-10-CM | POA: Insufficient documentation

## 2021-05-24 LAB — BASIC METABOLIC PANEL
Anion gap: 10 (ref 5–15)
BUN: 11 mg/dL (ref 6–20)
CO2: 26 mmol/L (ref 22–32)
Calcium: 8.9 mg/dL (ref 8.9–10.3)
Chloride: 102 mmol/L (ref 98–111)
Creatinine, Ser: 1.05 mg/dL — ABNORMAL HIGH (ref 0.44–1.00)
GFR, Estimated: >60 mL/min (ref >60)
Glucose, Bld: 101 mg/dL — ABNORMAL HIGH (ref 70–99)
Potassium: 2.9 mmol/L — ABNORMAL LOW (ref 3.5–5.1)
Sodium: 138 mmol/L (ref 135–145)

## 2021-05-24 LAB — CBC WITH DIFFERENTIAL/PLATELET
Abs Immature Granulocytes: 0.06 10*3/uL (ref 0.00–0.07)
Basophils Absolute: 0.1 10*3/uL (ref 0.0–0.1)
Basophils Relative: 0 %
Eosinophils Absolute: 0.1 10*3/uL (ref 0.0–0.5)
Eosinophils Relative: 1 %
HCT: 34 % — ABNORMAL LOW (ref 36.0–46.0)
Hemoglobin: 10 g/dL — ABNORMAL LOW (ref 12.0–15.0)
Immature Granulocytes: 1 %
Lymphocytes Relative: 23 %
Lymphs Abs: 2.8 10*3/uL (ref 0.7–4.0)
MCH: 21.1 pg — ABNORMAL LOW (ref 26.0–34.0)
MCHC: 29.4 g/dL — ABNORMAL LOW (ref 30.0–36.0)
MCV: 71.9 fL — ABNORMAL LOW (ref 80.0–100.0)
Monocytes Absolute: 0.8 10*3/uL (ref 0.1–1.0)
Monocytes Relative: 7 %
Neutro Abs: 8.5 10*3/uL — ABNORMAL HIGH (ref 1.7–7.7)
Neutrophils Relative %: 68 %
Platelets: 301 10*3/uL (ref 150–400)
RBC: 4.73 MIL/uL (ref 3.87–5.11)
RDW: 19.1 % — ABNORMAL HIGH (ref 11.5–15.5)
WBC: 12.3 10*3/uL — ABNORMAL HIGH (ref 4.0–10.5)
nRBC: 0 % (ref 0.0–0.2)

## 2021-05-24 LAB — RESP PANEL BY RT-PCR (FLU A&B, COVID) ARPGX2
Influenza A by PCR: NEGATIVE
Influenza B by PCR: NEGATIVE
SARS Coronavirus 2 by RT PCR: NEGATIVE

## 2021-05-24 NOTE — ED Triage Notes (Signed)
Pt states concerns for COVID+ exposure, elevated BP, and chest pain with body aches for 2 days.

## 2021-05-24 NOTE — ED Notes (Signed)
The patient had to leave due to loss of child care, leaving without being seen by a provider. The patient was informed that her results would be uploaded to [facility] but no one would explain her results to her.

## 2021-05-24 NOTE — ED Provider Notes (Signed)
Emergency Medicine Provider Triage Evaluation Note  Wendy Humphrey , a 35 y.o. female  was evaluated in triage.  Pt complains of patient presents with chief complaint of chest pain concern for COVID or possible pneumonia.  Patient states she has been having general body aches, as well as a sore throat, states this started yesterday, she states while she is in a car today she had intermittent chest pain which are in the lower chest, does not radiate, was not associated shortness of breath, diaphoretic, nausea or vomiting.  She has no history of PEs or DVTs, currently not on hormone therapy.  Review of Systems  Positive: Chest pain, body aches Negative: Abdominal pain nausea vomiting  Physical Exam  BP (!) 192/143 (BP Location: Left Arm)   Pulse (!) 108   Temp 99.4 F (37.4 C) (Oral)   Resp 20   Ht 5\' 4"  (1.626 m)   Wt (!) 145.2 kg   SpO2 95%   BMI 54.93 kg/m  Gen:   Awake, no distress   Resp:  Normal effort  MSK:   Moves extremities without difficulty  Other:  Cannot reproduce chest pain.  Medical Decision Making  Medically screening exam initiated at 8:22 PM.  Appropriate orders placed.  Wendy Humphrey was informed that the remainder of the evaluation will be completed by another provider, this initial triage assessment does not replace that evaluation, and the importance of remaining in the ED until their evaluation is complete.  Patient presents with chest pain, labwork imaging ordered, patient further work-up in the emergency department.   Marcello Fennel, PA-C 05/24/21 [place], [place], DO 05/24/21 2323

## 2021-05-25 ENCOUNTER — Telehealth (HOSPITAL_COMMUNITY): Payer: Self-pay | Admitting: Emergency Medicine

## 2021-05-25 NOTE — ED Notes (Unsigned)
Pt called requesting, to have e-mail updated. And to have new MyChart link sent ou to e-mail

## 2022-05-13 ENCOUNTER — Encounter (HOSPITAL_BASED_OUTPATIENT_CLINIC_OR_DEPARTMENT_OTHER): Payer: Self-pay

## 2022-05-13 ENCOUNTER — Emergency Department (HOSPITAL_BASED_OUTPATIENT_CLINIC_OR_DEPARTMENT_OTHER): Payer: Medicaid Other

## 2022-05-13 ENCOUNTER — Emergency Department (HOSPITAL_BASED_OUTPATIENT_CLINIC_OR_DEPARTMENT_OTHER)
Admission: EM | Admit: 2022-05-13 | Discharge: 2022-05-13 | Disposition: A | Discharge status: Home / Self Care | Payer: Medicaid Other | Attending: Emergency Medicine | Admitting: Emergency Medicine

## 2022-05-13 ENCOUNTER — Other Ambulatory Visit: Payer: Self-pay

## 2022-05-13 DIAGNOSIS — I10 Essential (primary) hypertension: Secondary | ICD-10-CM | POA: Insufficient documentation

## 2022-05-13 DIAGNOSIS — D72829 Elevated white blood cell count, unspecified: Secondary | ICD-10-CM | POA: Insufficient documentation

## 2022-05-13 DIAGNOSIS — R519 Headache, unspecified: Secondary | ICD-10-CM | POA: Diagnosis not present

## 2022-05-13 DIAGNOSIS — J45909 Unspecified asthma, uncomplicated: Secondary | ICD-10-CM | POA: Insufficient documentation

## 2022-05-13 DIAGNOSIS — Z79899 Other long term (current) drug therapy: Secondary | ICD-10-CM | POA: Diagnosis not present

## 2022-05-13 LAB — CBC WITH DIFFERENTIAL/PLATELET
Abs Immature Granulocytes: 0.08 10*3/uL — ABNORMAL HIGH (ref 0.00–0.07)
Basophils Absolute: 0 10*3/uL (ref 0.0–0.1)
Basophils Relative: 0 %
Eosinophils Absolute: 0.1 10*3/uL (ref 0.0–0.5)
Eosinophils Relative: 1 %
HCT: 32.3 % — ABNORMAL LOW (ref 36.0–46.0)
Hemoglobin: 9.3 g/dL — ABNORMAL LOW (ref 12.0–15.0)
Immature Granulocytes: 1 %
Lymphocytes Relative: 20 %
Lymphs Abs: 2.5 10*3/uL (ref 0.7–4.0)
MCH: 20.1 pg — ABNORMAL LOW (ref 26.0–34.0)
MCHC: 28.8 g/dL — ABNORMAL LOW (ref 30.0–36.0)
MCV: 69.9 fL — ABNORMAL LOW (ref 80.0–100.0)
Monocytes Absolute: 0.8 10*3/uL (ref 0.1–1.0)
Monocytes Relative: 7 %
Neutro Abs: 8.9 10*3/uL — ABNORMAL HIGH (ref 1.7–7.7)
Neutrophils Relative %: 71 %
Platelets: 259 10*3/uL (ref 150–400)
RBC: 4.62 MIL/uL (ref 3.87–5.11)
RDW: 19.9 % — ABNORMAL HIGH (ref 11.5–15.5)
WBC: 12.5 10*3/uL — ABNORMAL HIGH (ref 4.0–10.5)
nRBC: 0 % (ref 0.0–0.2)

## 2022-05-13 LAB — BASIC METABOLIC PANEL
Anion gap: 10 (ref 5–15)
BUN: 13 mg/dL (ref 6–20)
CO2: 31 mmol/L (ref 22–32)
Calcium: 9.5 mg/dL (ref 8.9–10.3)
Chloride: 98 mmol/L (ref 98–111)
Creatinine, Ser: 1.23 mg/dL — ABNORMAL HIGH (ref 0.44–1.00)
GFR, Estimated: 59 mL/min — ABNORMAL LOW (ref >60)
Glucose, Bld: 106 mg/dL — ABNORMAL HIGH (ref 70–99)
Potassium: 2.9 mmol/L — ABNORMAL LOW (ref 3.5–5.1)
Sodium: 139 mmol/L (ref 135–145)

## 2022-05-13 MED ORDER — LABETALOL HCL 5 MG/ML IV SOLN
10.0000 mg | Freq: Once | INTRAVENOUS | Status: AC
  Administered 2022-05-13: 10 mg via INTRAVENOUS
  Filled 2022-05-13: qty 4

## 2022-05-13 MED ORDER — FENTANYL CITRATE PF 50 MCG/ML IJ SOSY
50.0000 ug | PREFILLED_SYRINGE | Freq: Once | INTRAMUSCULAR | Status: DC
  Filled 2022-05-13: qty 1

## 2022-05-13 MED ORDER — LABETALOL HCL 5 MG/ML IV SOLN
20.0000 mg | Freq: Once | INTRAVENOUS | Status: AC
  Administered 2022-05-13: 20 mg via INTRAVENOUS
  Filled 2022-05-13: qty 4

## 2022-05-13 MED ORDER — METOCLOPRAMIDE HCL 5 MG/ML IJ SOLN
10.0000 mg | Freq: Once | INTRAMUSCULAR | Status: AC
  Administered 2022-05-13: 10 mg via INTRAVENOUS
  Filled 2022-05-13: qty 2

## 2022-05-13 MED ORDER — POTASSIUM CHLORIDE CRYS ER 20 MEQ PO TBCR
40.0000 meq | EXTENDED_RELEASE_TABLET | Freq: Once | ORAL | Status: AC
  Administered 2022-05-13: 40 meq via ORAL
  Filled 2022-05-13: qty 2

## 2022-05-13 MED ORDER — DIPHENHYDRAMINE HCL 50 MG/ML IJ SOLN
50.0000 mg | Freq: Once | INTRAMUSCULAR | Status: AC
  Administered 2022-05-13: 50 mg via INTRAVENOUS
  Filled 2022-05-13: qty 1

## 2022-05-13 MED ORDER — KETOROLAC TROMETHAMINE 30 MG/ML IJ SOLN
15.0000 mg | Freq: Once | INTRAMUSCULAR | Status: AC
  Administered 2022-05-13: 15 mg via INTRAVENOUS
  Filled 2022-05-13: qty 1

## 2022-05-13 NOTE — ED Triage Notes (Signed)
Patient here POV from Home.  Endorses having Sharp Pains to Left Posterior Head and Tenderness to Anterior Head. States this began 2 days PTA. Worsened today and has been Constant.  OTC Medications have been ineffective. No Fevers. No N/V/D. No Photosensitivity.   NAD Noted during Triage. A&Ox4. GCS 15. Ambulatory.

## 2022-05-13 NOTE — Discharge Instructions (Addendum)
Make sure to take your blood pressure as directed.

## 2022-05-13 NOTE — ED Notes (Signed)
Pt transported to CT ?

## 2022-05-13 NOTE — ED Provider Notes (Signed)
[facility] EMERGENCY DEPT Provider Note   CSN: [id number] Arrival date & time: 05/13/22  1831     History  Chief Complaint  Patient presents with   Headache    MONACA WADAS is a 36 y.o. female.  Patient presents to the hospital complaining of headache that has been ongoing for 2 days.  Patient states that she had an increase in headache severity today.  Patient states that the pain is on the left side of her head.  Denies fever, nausea, vomiting, diarrhea, photosensitivity.  Patient has past medical history significant for migraines, asthma, anxiety, hypertension, obesity  HPI     Home Medications Prior to Admission medications   Medication Sig Start Date End Date Taking? Authorizing Provider  acetaminophen (TYLENOL) 500 MG tablet Take 500 mg by mouth every 6 (six) hours as needed for mild pain, moderate pain or headache.    [provider]  amLODipine (NORVASC) 10 MG tablet Take 1 tablet (10 mg total) by mouth daily for 30 days. 03/05/19 05/16/21  McDonald, Mia A, PA-C  amoxicillin-clavulanate (AUGMENTIN) 875-125 MG tablet Take 1 tablet by mouth every 12 (twelve) hours. Patient not taking: Reported on 05/16/2020 05/30/19   Jaynee Eagles, PA-C  hydrochlorothiazide (HYDRODIURIL) 25 MG tablet Take 1 tablet (25 mg total) by mouth daily for 30 days. 03/05/19 05/16/21  McDonald, Mia A, PA-C  hydrOXYzine (ATARAX/VISTARIL) 25 MG tablet Take 1 tablet (25 mg total) by mouth every 6 (six) hours as needed for anxiety. Patient not taking: Reported on 05/16/2020 04/05/16   Domenic Moras, PA-C  levothyroxine (SYNTHROID) 125 MCG tablet Take 1 tablet (125 mcg total) by mouth daily for 30 days. 03/05/19 05/16/21  McDonald, Mia A, PA-C  lidocaine (XYLOCAINE) 2 % solution Use as directed 15 mLs in the mouth or throat as needed for mouth pain. Do not swallow. Patient not taking: Reported on 05/16/2020 05/30/19   Jaynee Eagles, PA-C  losartan (COZAAR) 50 MG tablet Take 1 tablet (50 mg total) by mouth  daily for 30 days. 03/05/19 05/16/21  McDonald, Mia A, PA-C  PROAIR HFA 108 (90 BASE) MCG/ACT inhaler Inhale 2 puffs into the lungs daily as needed for wheezing or shortness of breath.  10/29/14   [provider]  traMADol (ULTRAM) 50 MG tablet Take 1 tablet (50 mg total) by mouth every 6 (six) hours as needed for severe pain. Patient not taking: Reported on 05/16/2020 05/30/19   Jaynee Eagles, PA-C  Vitamin D, Ergocalciferol, (DRISDOL) 1.25 MG (50000 UNIT) CAPS capsule Take 50,000 Units by mouth once a week. On Mondays 04/13/20   [provider]      Allergies    Patient has no known allergies.    Review of Systems   Review of Systems  Constitutional:  Negative for fever.  Eyes:  Negative for photophobia and visual disturbance.  Respiratory:  Negative for shortness of breath.   Cardiovascular:  Negative for chest pain.  Gastrointestinal:  Negative for abdominal pain, nausea and vomiting.  Genitourinary:  Negative for dysuria.  Neurological:  Positive for headaches. Negative for dizziness, syncope and light-headedness.    Physical Exam Updated Vital Signs BP (!) 196/133   Pulse 79   Temp 98.8 F (37.1 C) (Oral)   Resp 15   Ht '5\' 4"'$  (1.626 m)   Wt (!) 145.2 kg   SpO2 99%   BMI 54.95 kg/m  Physical Exam Vitals and nursing note reviewed.  Constitutional:      General: She is not  in acute distress.    Appearance: She is obese.  HENT:     Head: Normocephalic and atraumatic.  Eyes:     Extraocular Movements: Extraocular movements intact.     Pupils: Pupils are equal, round, and reactive to light.  Cardiovascular:     Rate and Rhythm: Normal rate and regular rhythm.     Heart sounds: Normal heart sounds.  Pulmonary:     Effort: Pulmonary effort is normal.     Breath sounds: Normal breath sounds.  Abdominal:     General: There is no distension.     Palpations: Abdomen is soft.  Musculoskeletal:        General: Normal range of motion.     Cervical back: Normal  range of motion and neck supple.  Skin:    General: Skin is warm and dry.  Neurological:     Mental Status: She is alert.     Cranial Nerves: No cranial nerve deficit.     Sensory: No sensory deficit.  Psychiatric:        Mood and Affect: Mood normal.     ED Results / Procedures / Treatments   Labs (all labs ordered are listed, but only abnormal results are displayed) Labs Reviewed  BASIC METABOLIC PANEL - Abnormal; Notable for the following components:      Result Value   Potassium 2.9 (*)    Glucose, Bld 106 (*)    Creatinine, Ser 1.23 (*)    GFR, Estimated 59 (*)    All other components within normal limits  CBC WITH DIFFERENTIAL/PLATELET - Abnormal; Notable for the following components:   WBC 12.5 (*)    Hemoglobin 9.3 (*)    HCT 32.3 (*)    MCV 69.9 (*)    MCH 20.1 (*)    MCHC 28.8 (*)    RDW 19.9 (*)    Neutro Abs 8.9 (*)    Abs Immature Granulocytes 0.08 (*)    All other components within normal limits  URINALYSIS, ROUTINE W REFLEX MICROSCOPIC    EKG None  Radiology CT Head Wo Contrast  Result Date: 05/13/2022 CLINICAL DATA:  Sudden onset headaches EXAM: CT HEAD WITHOUT CONTRAST TECHNIQUE: Contiguous axial images were obtained from the base of the skull through the vertex without intravenous contrast. RADIATION DOSE REDUCTION: This exam was performed according to the departmental dose-optimization program which includes automated exposure control, adjustment of the mA and/or kV according to patient size and/or use of iterative reconstruction technique. COMPARISON:  05/16/2020 FINDINGS: Brain: No evidence of acute infarction, hemorrhage, hydrocephalus, extra-axial collection or mass lesion/mass effect. Vascular: No hyperdense vessel or unexpected calcification. Skull: Normal. Negative for fracture or focal lesion. Sinuses/Orbits: No acute finding. Other: None. IMPRESSION: No acute intracranial abnormality noted. Electronically Signed   By: Inez Catalina M.D.   On:  05/13/2022 21:19    Procedures Procedures    Medications Ordered in ED Medications  fentaNYL (SUBLIMAZE) injection 50 mcg (has no administration in time range)  ketorolac (TORADOL) 30 MG/ML injection 15 mg (has no administration in time range)  labetalol (NORMODYNE) injection 20 mg (20 mg Intravenous Given 05/13/22 2101)  metoCLOPramide (REGLAN) injection 10 mg (10 mg Intravenous Given 05/13/22 2100)  diphenhydrAMINE (BENADRYL) injection 50 mg (50 mg Intravenous Given 05/13/22 2100)  labetalol (NORMODYNE) injection 10 mg (10 mg Intravenous Given 05/13/22 2204)    ED Course/ Medical Decision Making/ A&P  Medical Decision Making Amount and/or Complexity of Data Reviewed Labs: ordered. Radiology: ordered.  Risk Prescription drug management.   This patient presents to the ED for concern of headache, this involves an extensive number of treatment options, and is a complaint that carries with it a high risk of complications and morbidity.  The differential diagnosis includes migraine, hypertensive emergency, intracranial abnormality, other headache disorder   Co morbidities that complicate the patient evaluation  Obesity, hypertension   Lab Tests:  I Ordered, and personally interpreted labs.  The pertinent results include: WBC 12.5, hemoglobin 9.  3, potassium 2.9, creatinine 1.23.  All abnormal labs are similar to patient's baseline   Imaging Studies ordered:  I ordered imaging studies including CT head I independently visualized and interpreted imaging which showed no acute intracranial abnormality I agree with the radiologist interpretation   Problem List / ED Course / Critical interventions / Medication management  I ordered medication including labetalol for hypertension, Benadryl and Reglan for headache, fentanyl and Toradol for pain Reevaluation of the patient after these medicines showed that the patient improved I have reviewed the patients home  medicines and have made adjustments as needed   Test / Admission - Considered:  Patient care being signed out at shift change to [facility]. Plan for probable discharge home after urinalysis and reassessment of pain and blood pressure         Final Clinical Impression(s) / ED Diagnoses Final diagnoses:  Hypertension, unspecified type  Bad headache    Rx / DC Orders ED Discharge Orders     None         Ronny Bacon 05/13/22 2207    Lacretia Leigh, MD 05/13/22 2213

## 2022-08-11 ENCOUNTER — Emergency Department (HOSPITAL_BASED_OUTPATIENT_CLINIC_OR_DEPARTMENT_OTHER): Payer: Medicaid Other | Admitting: Radiology

## 2022-08-11 ENCOUNTER — Other Ambulatory Visit: Payer: Self-pay

## 2022-08-11 ENCOUNTER — Encounter (HOSPITAL_BASED_OUTPATIENT_CLINIC_OR_DEPARTMENT_OTHER): Payer: Self-pay

## 2022-08-11 ENCOUNTER — Observation Stay (HOSPITAL_BASED_OUTPATIENT_CLINIC_OR_DEPARTMENT_OTHER)
Admission: EM | Admit: 2022-08-11 | Discharge: 2022-08-12 | Disposition: A | Discharge status: Home / Self Care | Payer: Medicaid Other | Attending: Family Medicine | Admitting: Family Medicine

## 2022-08-11 ENCOUNTER — Encounter (HOSPITAL_COMMUNITY): Payer: Self-pay

## 2022-08-11 DIAGNOSIS — J45901 Unspecified asthma with (acute) exacerbation: Principal | ICD-10-CM | POA: Diagnosis present

## 2022-08-11 DIAGNOSIS — I509 Heart failure, unspecified: Secondary | ICD-10-CM | POA: Diagnosis not present

## 2022-08-11 DIAGNOSIS — D649 Anemia, unspecified: Secondary | ICD-10-CM | POA: Diagnosis present

## 2022-08-11 DIAGNOSIS — I110 Hypertensive heart disease with heart failure: Secondary | ICD-10-CM | POA: Insufficient documentation

## 2022-08-11 DIAGNOSIS — R0602 Shortness of breath: Secondary | ICD-10-CM | POA: Diagnosis present

## 2022-08-11 DIAGNOSIS — I161 Hypertensive emergency: Secondary | ICD-10-CM | POA: Insufficient documentation

## 2022-08-11 DIAGNOSIS — E876 Hypokalemia: Secondary | ICD-10-CM

## 2022-08-11 DIAGNOSIS — Z79899 Other long term (current) drug therapy: Secondary | ICD-10-CM | POA: Diagnosis not present

## 2022-08-11 DIAGNOSIS — Z6841 Body Mass Index (BMI) 40.0 and over, adult: Secondary | ICD-10-CM

## 2022-08-11 DIAGNOSIS — E039 Hypothyroidism, unspecified: Secondary | ICD-10-CM | POA: Diagnosis present

## 2022-08-11 DIAGNOSIS — I5021 Acute systolic (congestive) heart failure: Secondary | ICD-10-CM | POA: Diagnosis not present

## 2022-08-11 DIAGNOSIS — Z20822 Contact with and (suspected) exposure to covid-19: Secondary | ICD-10-CM | POA: Insufficient documentation

## 2022-08-11 DIAGNOSIS — I10 Essential (primary) hypertension: Secondary | ICD-10-CM | POA: Diagnosis present

## 2022-08-11 LAB — SARS CORONAVIRUS 2 BY RT PCR: SARS Coronavirus 2 by RT PCR: NEGATIVE

## 2022-08-11 LAB — URINALYSIS, ROUTINE W REFLEX MICROSCOPIC
Bilirubin Urine: NEGATIVE
Glucose, UA: NEGATIVE mg/dL
Hgb urine dipstick: NEGATIVE
Ketones, ur: NEGATIVE mg/dL
Nitrite: POSITIVE — AB
Protein, ur: 30 mg/dL — AB
Specific Gravity, Urine: 1.01 (ref 1.005–1.030)
pH: 7.5 (ref 5.0–8.0)

## 2022-08-11 LAB — MAGNESIUM: Magnesium: 1.9 mg/dL (ref 1.7–2.4)

## 2022-08-11 LAB — CBC WITH DIFFERENTIAL/PLATELET
Abs Immature Granulocytes: 0.06 10*3/uL (ref 0.00–0.07)
Basophils Absolute: 0 10*3/uL (ref 0.0–0.1)
Basophils Relative: 0 %
Eosinophils Absolute: 0 10*3/uL (ref 0.0–0.5)
Eosinophils Relative: 0 %
HCT: 28.2 % — ABNORMAL LOW (ref 36.0–46.0)
Hemoglobin: 8.3 g/dL — ABNORMAL LOW (ref 12.0–15.0)
Immature Granulocytes: 1 %
Lymphocytes Relative: 11 %
Lymphs Abs: 1.3 10*3/uL (ref 0.7–4.0)
MCH: 20.2 pg — ABNORMAL LOW (ref 26.0–34.0)
MCHC: 29.4 g/dL — ABNORMAL LOW (ref 30.0–36.0)
MCV: 68.8 fL — ABNORMAL LOW (ref 80.0–100.0)
Monocytes Absolute: 0.7 10*3/uL (ref 0.1–1.0)
Monocytes Relative: 6 %
Neutro Abs: 9.5 10*3/uL — ABNORMAL HIGH (ref 1.7–7.7)
Neutrophils Relative %: 82 %
Platelets: 240 10*3/uL (ref 150–400)
RBC: 4.1 MIL/uL (ref 3.87–5.11)
RDW: 19.8 % — ABNORMAL HIGH (ref 11.5–15.5)
WBC: 11.6 10*3/uL — ABNORMAL HIGH (ref 4.0–10.5)
nRBC: 0 % (ref 0.0–0.2)

## 2022-08-11 LAB — COMPREHENSIVE METABOLIC PANEL
ALT: 11 U/L (ref 0–44)
AST: 12 U/L — ABNORMAL LOW (ref 15–41)
Albumin: 3.9 g/dL (ref 3.5–5.0)
Alkaline Phosphatase: 70 U/L (ref 38–126)
Anion gap: 12 (ref 5–15)
BUN: 11 mg/dL (ref 6–20)
CO2: 27 mmol/L (ref 22–32)
Calcium: 8.9 mg/dL (ref 8.9–10.3)
Chloride: 101 mmol/L (ref 98–111)
Creatinine, Ser: 1.03 mg/dL — ABNORMAL HIGH (ref 0.44–1.00)
GFR, Estimated: >60 mL/min (ref >60)
Glucose, Bld: 103 mg/dL — ABNORMAL HIGH (ref 70–99)
Potassium: 2.7 mmol/L — CL (ref 3.5–5.1)
Sodium: 140 mmol/L (ref 135–145)
Total Bilirubin: 0.8 mg/dL (ref 0.3–1.2)
Total Protein: 7.7 g/dL (ref 6.5–8.1)

## 2022-08-11 LAB — TROPONIN I (HIGH SENSITIVITY)
Troponin I (High Sensitivity): 14 ng/L (ref <18)
Troponin I (High Sensitivity): 12 ng/L (ref <18)

## 2022-08-11 LAB — PREGNANCY, URINE: Preg Test, Ur: NEGATIVE

## 2022-08-11 LAB — PROTIME-INR
INR: 1.2 (ref 0.8–1.2)
Prothrombin Time: 14.9 seconds (ref 11.4–15.2)

## 2022-08-11 LAB — BRAIN NATRIURETIC PEPTIDE: B Natriuretic Peptide: 443.5 pg/mL — ABNORMAL HIGH (ref 0.0–100.0)

## 2022-08-11 LAB — LACTIC ACID, PLASMA: Lactic Acid, Venous: 1.9 mmol/L (ref 0.5–1.9)

## 2022-08-11 MED ORDER — SODIUM CHLORIDE 0.9% FLUSH: 3.0000 mL | INTRAVENOUS | Status: DC | PRN

## 2022-08-11 MED ORDER — ALBUTEROL SULFATE (2.5 MG/3ML) 0.083% IN NEBU: 2.5000 mg | INHALATION_SOLUTION | Freq: Every day | RESPIRATORY_TRACT | Status: DC | PRN

## 2022-08-11 MED ORDER — ENOXAPARIN SODIUM 40 MG/0.4ML IJ SOSY
40.0000 mg | PREFILLED_SYRINGE | INTRAMUSCULAR | Status: DC
  Administered 2022-08-12: 40 mg via SUBCUTANEOUS
  Filled 2022-08-11: qty 0.4

## 2022-08-11 MED ORDER — IPRATROPIUM-ALBUTEROL 0.5-2.5 (3) MG/3ML IN SOLN
RESPIRATORY_TRACT | Status: AC
  Administered 2022-08-11: 3 mL via RESPIRATORY_TRACT
  Filled 2022-08-11: qty 3

## 2022-08-11 MED ORDER — FUROSEMIDE 10 MG/ML IJ SOLN
20.0000 mg | Freq: Once | INTRAMUSCULAR | Status: AC
  Administered 2022-08-11: 20 mg via INTRAVENOUS
  Filled 2022-08-11: qty 2

## 2022-08-11 MED ORDER — POTASSIUM CHLORIDE 10 MEQ/100ML IV SOLN
10.0000 meq | INTRAVENOUS | Status: AC
  Administered 2022-08-11 (×3): 10 meq via INTRAVENOUS
  Filled 2022-08-11 (×3): qty 100

## 2022-08-11 MED ORDER — MOMETASONE FURO-FORMOTEROL FUM 200-5 MCG/ACT IN AERO
2.0000 | INHALATION_SPRAY | Freq: Two times a day (BID) | RESPIRATORY_TRACT | Status: DC
  Administered 2022-08-12: 2 via RESPIRATORY_TRACT
  Filled 2022-08-11: qty 8.8

## 2022-08-11 MED ORDER — ACETAMINOPHEN 500 MG PO TABS
1000.0000 mg | ORAL_TABLET | Freq: Four times a day (QID) | ORAL | Status: DC | PRN
  Administered 2022-08-11: 1000 mg via ORAL
  Filled 2022-08-11: qty 2

## 2022-08-11 MED ORDER — IPRATROPIUM-ALBUTEROL 0.5-2.5 (3) MG/3ML IN SOLN: 3.0000 mL | Freq: Once | RESPIRATORY_TRACT | Status: AC

## 2022-08-11 MED ORDER — PREDNISONE 50 MG PO TABS
60.0000 mg | ORAL_TABLET | Freq: Once | ORAL | Status: AC
  Administered 2022-08-11: 60 mg via ORAL
  Filled 2022-08-11: qty 1

## 2022-08-11 MED ORDER — SODIUM CHLORIDE 0.9% FLUSH
3.0000 mL | Freq: Two times a day (BID) | INTRAVENOUS | Status: DC
  Administered 2022-08-11 – 2022-08-12 (×2): 3 mL via INTRAVENOUS

## 2022-08-11 MED ORDER — ALBUTEROL SULFATE (2.5 MG/3ML) 0.083% IN NEBU: 2.5000 mg | INHALATION_SOLUTION | RESPIRATORY_TRACT | Status: DC | PRN

## 2022-08-11 MED ORDER — POTASSIUM CHLORIDE CRYS ER 20 MEQ PO TBCR
40.0000 meq | EXTENDED_RELEASE_TABLET | Freq: Two times a day (BID) | ORAL | Status: DC
  Administered 2022-08-11 – 2022-08-12 (×3): 40 meq via ORAL
  Filled 2022-08-11 (×4): qty 2

## 2022-08-11 MED ORDER — AMLODIPINE BESYLATE 5 MG PO TABS
10.0000 mg | ORAL_TABLET | Freq: Once | ORAL | Status: AC
  Administered 2022-08-11: 10 mg via ORAL
  Filled 2022-08-11: qty 2

## 2022-08-11 MED ORDER — FUROSEMIDE 10 MG/ML IJ SOLN
40.0000 mg | Freq: Every day | INTRAMUSCULAR | Status: DC
  Administered 2022-08-12: 40 mg via INTRAVENOUS
  Filled 2022-08-11: qty 4

## 2022-08-11 MED ORDER — ONDANSETRON HCL 4 MG/2ML IJ SOLN: 4.0000 mg | Freq: Four times a day (QID) | INTRAMUSCULAR | Status: DC | PRN

## 2022-08-11 MED ORDER — LOSARTAN POTASSIUM 50 MG PO TABS
50.0000 mg | ORAL_TABLET | Freq: Every day | ORAL | Status: DC
  Administered 2022-08-12: 50 mg via ORAL
  Filled 2022-08-11: qty 1

## 2022-08-11 MED ORDER — LABETALOL HCL 5 MG/ML IV SOLN
20.0000 mg | Freq: Once | INTRAVENOUS | Status: AC
  Administered 2022-08-11: 20 mg via INTRAVENOUS
  Filled 2022-08-11: qty 4

## 2022-08-11 MED ORDER — SODIUM CHLORIDE 0.9 % IV SOLN: 250.0000 mL | INTRAVENOUS | Status: DC | PRN

## 2022-08-11 MED ORDER — LABETALOL HCL 5 MG/ML IV SOLN
20.0000 mg | INTRAVENOUS | Status: DC | PRN
  Administered 2022-08-12 (×3): 20 mg via INTRAVENOUS
  Filled 2022-08-11 (×3): qty 4

## 2022-08-11 MED ORDER — LABETALOL HCL 5 MG/ML IV SOLN
10.0000 mg | Freq: Once | INTRAVENOUS | Status: AC
  Administered 2022-08-11: 10 mg via INTRAVENOUS
  Filled 2022-08-11: qty 4

## 2022-08-11 MED ORDER — CLONIDINE HCL 0.1 MG PO TABS
0.1000 mg | ORAL_TABLET | Freq: Once | ORAL | Status: AC
  Administered 2022-08-11: 0.1 mg via ORAL
  Filled 2022-08-11: qty 1

## 2022-08-11 MED ORDER — AMLODIPINE BESYLATE 10 MG PO TABS
10.0000 mg | ORAL_TABLET | Freq: Every day | ORAL | Status: DC
  Administered 2022-08-12: 10 mg via ORAL
  Filled 2022-08-11: qty 1

## 2022-08-11 MED ORDER — NITROGLYCERIN 0.4 MG SL SUBL: 0.4000 mg | SUBLINGUAL_TABLET | SUBLINGUAL | Status: DC | PRN

## 2022-08-11 MED ORDER — IPRATROPIUM-ALBUTEROL 0.5-2.5 (3) MG/3ML IN SOLN
3.0000 mL | Freq: Once | RESPIRATORY_TRACT | Status: AC
  Administered 2022-08-11: 3 mL via RESPIRATORY_TRACT
  Filled 2022-08-11: qty 3

## 2022-08-11 NOTE — Assessment & Plan Note (Addendum)
Possibly secondary to exposure. Patient managed on prednisone and breathing treatments with improvement in symptoms. No hypoxia. patient ambulated without subsequent hypoxia or dyspnea. Continue albuterol.

## 2022-08-11 NOTE — ED Provider Notes (Signed)
Care transferred to me.  After IV labetalol, blood pressure is 183/118, previous to that was 184/127.  She tells me she was recently started on clonidine but has not yet started it.  She also did not take her meds today which includes amlodipine and HCTZ.  She is not having any current chest pain.  5:21 PM Discussed with Dr. Erlinda Hong. She recommends another dose of 20 mg IV lasix.  An asthma history also wants to treat with steroids and treat for a combined problem.  She will put her in progressive.  BP has steadily improved into the 170s and later 160s.  She is stable for transfer.   Sherwood Gambler, MD 08/11/22 2350

## 2022-08-11 NOTE — H&P (Signed)
History and Physical    Patient: Wendy Humphrey YTK:[id number] DOB: 09-08-1986 DOA: 08/11/2022 DOS: the patient was seen and examined on 08/11/2022 PCP: [facility]  Patient coming from: Home  Chief Complaint:  Chief Complaint  Patient presents with   Shortness of Breath   HPI: Wendy Humphrey is a 36 y.o. female with medical history significant of Asthma, morbid obesity, HTN, hypothyroidism.  Pt has not been taking BP meds for many months since running out of them a while ago.  Pt had been in usual state of health until ~3 days ago when she went to visit friend and stay at friends house.  Patient reports there are cats in friends house and also reports second hand smoke exposure in friends house (pt is not herself a smoker).  Pt reports wheezing and SOB.  Intermittent CP lasting a few seconds at times over past couple of days.  No leg pain, no leg swelling, no orthopnea.  No fever, congestion, sore throat.  No smoking, etoh, drugs No immediate fam hx of CAD, grandpa with CAD No hx DVT/PE, not on estrogens/OCP, no recent surgeries, immobilization.  Review of Systems: As mentioned in the history of present illness. All other systems reviewed and are negative. Past Medical History:  Diagnosis Date   Anxiety    Asthma    Hypertension    Migraine    Morphea    Obesity    Thyroid disease    History reviewed. No pertinent surgical history. Social History:  reports that she has never smoked. She has never used smokeless tobacco. She reports that she does not drink alcohol and does not use drugs.  No Known Allergies  History reviewed. No pertinent family history. Grandfather had CAD  Prior to Admission medications   Medication Sig Start Date End Date Taking? Authorizing Provider  acetaminophen (TYLENOL) 500 MG tablet Take 500 mg by mouth every 6 (six) hours as needed for mild pain, moderate pain or headache.    [provider]  amLODipine (NORVASC) 10 MG  tablet Take 1 tablet (10 mg total) by mouth daily for 30 days. 03/05/19 05/16/21  McDonald, Mia A, PA-C  hydrochlorothiazide (HYDRODIURIL) 25 MG tablet Take 1 tablet (25 mg total) by mouth daily for 30 days. 03/05/19 05/16/21  McDonald, Mia A, PA-C  levothyroxine (SYNTHROID) 125 MCG tablet Take 1 tablet (125 mcg total) by mouth daily for 30 days. 03/05/19 05/16/21  McDonald, Mia A, PA-C  losartan (COZAAR) 50 MG tablet Take 1 tablet (50 mg total) by mouth daily for 30 days. 03/05/19 05/16/21  McDonald, Mia A, PA-C  PROAIR HFA 108 (90 BASE) MCG/ACT inhaler Inhale 2 puffs into the lungs daily as needed for wheezing or shortness of breath.  10/29/14   [provider]  Vitamin D, Ergocalciferol, (DRISDOL) 1.25 MG (50000 UNIT) CAPS capsule Take 50,000 Units by mouth once a week. On Mondays 04/13/20   [provider]    Physical Exam: Vitals:   08/11/22 1800 08/11/22 1823 08/11/22 1930 08/11/22 2105  BP: (!) 173/117  (!) 173/111 (!) 199/139  Pulse: (!) 101 (!) 105 (!) 106 (!) 107  Resp:  (!) 23 (!) 27 20  Temp:    98.3 F (36.8 C)  TempSrc:    Oral  SpO2: 94% 95% 95% 96%  Weight:    (!) 169.5 kg  Height:    '5\' 4"'$  (1.626 m)   Constitutional: NAD, calm, comfortable Eyes: PERRL, lids and conjunctivae normal ENMT: Mucous membranes  are moist. Posterior pharynx clear of any exudate or lesions.Normal dentition.  Neck: normal, supple, no masses, no thyromegaly Respiratory: clear to auscultation bilaterally, no wheezing, no crackles. Normal respiratory effort. No accessory muscle use.  Cardiovascular: Regular rate and rhythm, no murmurs / rubs / gallops. No extremity edema. 2+ pedal pulses. No carotid bruits.  Abdomen: no tenderness, no masses palpated. No hepatosplenomegaly. Bowel sounds positive.  Musculoskeletal: no clubbing / cyanosis. No joint deformity upper and lower extremities. Good ROM, no contractures. Normal muscle tone.  Skin: no rashes, lesions, ulcers. No induration Neurologic: CN  2-12 grossly intact. Sensation intact, DTR normal. Strength 5/5 in all 4.  Psychiatric: Normal judgment and insight. Alert and oriented x 3. Normal mood.   Data Reviewed:       Latest Ref Rng & Units 08/11/2022   11:38 AM 05/13/2022    9:01 PM 05/24/2021    8:28 PM  CBC  WBC 4.0 - 10.5 K/uL 11.6  12.5  12.3   Hemoglobin 12.0 - 15.0 g/dL 8.3  9.3  10.0   Hematocrit 36.0 - 46.0 % 28.2  32.3  34.0   Platelets 150 - 400 K/uL 240  259  301       Latest Ref Rng & Units 08/11/2022   11:38 AM 05/13/2022    9:01 PM 05/24/2021    8:28 PM  CMP  Glucose 70 - 99 mg/dL 103  106  101   BUN 6 - 20 mg/dL '11  13  11   '$ Creatinine 0.44 - 1.00 mg/dL 1.03  1.23  1.05   Sodium 135 - 145 mmol/L 140  139  138   Potassium 3.5 - 5.1 mmol/L 2.7  2.9  2.9   Chloride 98 - 111 mmol/L 101  98  102   CO2 22 - 32 mmol/L '27  31  26   '$ Calcium 8.9 - 10.3 mg/dL 8.9  9.5  8.9   Total Protein 6.5 - 8.1 g/dL 7.7     Total Bilirubin 0.3 - 1.2 mg/dL 0.8     Alkaline Phos 38 - 126 U/L 70     AST 15 - 41 U/L 12     ALT 0 - 44 U/L 11      CXR: IMPRESSION: 1. Mild cardiac enlargement and pulmonary vascular congestion. 2. Suspect trace pleural fluid bilaterally.  EKG: ST changes that are similar to prior.  Suspicious for LVH with secondary repol disturbance.  Trop neg x2  BNP 443  Assessment and Plan: * Asthma, chronic, unspecified asthma severity, with acute exacerbation Asthma exacerbation due to Cat and Smoking exposure over past couple of days seems to be the most major cause of her respiratory complaints today. Albuterol PRN Dulera scheduled Got '60mg'$  prednisone x1 in ED, will hold off on ordering further systemic steroids for the moment and see how she does.  Acute CHF (congestive heart failure) ([place]) Do not yet know wether this will be systolic or diastolic (needs 2d echo to determine). 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 CHF pathway Lasix '40mg'$  IV daily for the moment Strict intake and output 2d echo ordered and  pending Tele monitor  Malignant hypertension Uncontrolled HTN likely as cause of acute CHF. Hadnt been on any BP meds for many months. PRN labetalol IV Amlodipine '10mg'$  po daily (got in ED) Will re-start cozaar as well which she used to be on Holding of on restarting thiazide for the moment since we are doing lasix  Anemia Looks like patient has a chronic microcytic /  hypochromic anemia, slowly progressive over years.  Suspect probably iron deficiency, ? Menorrhagia? Anemia panel ordered to work this up Repeat CBC in AM  Morbid obesity with body mass index of 60.0-69.9 in adult ([place]) Check A1C but no h/o DM and FBG only 103 today in ED Sadly it seems that medicaid doesn't cover obesity treatment medications like Wegovy.  Hypothyroidism Looks like she was previously on synthroid in past. Checking TSH      Advance Care Planning:   Code Status: Full Code   Consults: None  Family Communication: No family in room  Severity of Illness: The appropriate patient status for this patient is OBSERVATION. Observation status is judged to be reasonable and necessary in order to provide the required intensity of service to ensure the patient's safety. The patient's presenting symptoms, physical exam findings, and initial radiographic and laboratory data in the context of their medical condition is felt to place them at decreased risk for further clinical deterioration. Furthermore, it is anticipated that the patient will be medically stable for discharge from the hospital within 2 midnights of admission.   Author: Etta Quill., DO 08/11/2022 9:39 PM  For on call review [email or web address].

## 2022-08-11 NOTE — Assessment & Plan Note (Addendum)
Looks like patient has a chronic microcytic / hypochromic anemia, slowly progressive over years.  Suspect probably iron deficiency, ? Menorrhagia? 1. Anemia panel ordered to work this up 2. Repeat CBC in AM

## 2022-08-11 NOTE — ED Notes (Signed)
EDP notified of pt VS

## 2022-08-11 NOTE — Assessment & Plan Note (Addendum)
Elevated BNP of 443.5 with no clinical symptoms of heart failure. Patient empirically treated with Lasix IV. Transthoracic Echocardiogram obtained and was significant for an LVEF of 40-45% with global hypokinesis and severe ventricular hypertrophy. Discussed case with cardiology who recommended non-urgent outpatient cardiology/heart failure follow-up and consideration of a cardiac MRI. Continue losartan. Adding Coreg and Lasix. Discussed fluid restriction and limited salt intake.

## 2022-08-11 NOTE — ED Notes (Addendum)
Patients family now at bedside. Brought baked chicken for dinner. Understands to keep liquids to a minimum. Patient calm/cooperative. Awaiting admission at this time. Will continue to monitor .  1900: Report given to Cooper Render for continuation of care.

## 2022-08-11 NOTE — ED Notes (Signed)
Wendy Humphrey was given 2.'5mg'$  Duoneb in triage.  She was slightly diminished in her lung bases before treatment and had a slight expiratory wheeze in her LLL post nebulizer treatment.

## 2022-08-11 NOTE — ED Provider Notes (Signed)
[facility] EMERGENCY DEPT Provider Note   CSN: [id number] Arrival date & time: 08/11/22  0759     History  Chief Complaint  Patient presents with   Shortness of Breath    Wendy Humphrey is a 36 y.o. female.  HPI     3 days shortness of breath Was at friends house with cats Episodes when it worsens, waxing and waning Wheezing, sometimes like a roar If get up and walk around is short of breath Had some chest pain, sharp pain comes and goes, CP comes on for a few seconds then goes over last few days so difficult to say excacerbating/relieving No leg pain or swelling, no dyspnea with laying flat, just always there Cough sometimes over last few days No congestion, sore throat, fever  Dr. Rx some potassium yesterday, had recently increased bp medications because was running higher at home  Hx of asthma, felt improved with breathing treatments, used sons albuterol at home  No smoking, etoh, drugs No immediate fam hx of CAD, grandpa with CAD No hx DVT/PE, not on estrogens/OCP, no recent surgeries, immobilization  Taking bp medications at home.  Past Medical History:  Diagnosis Date   Anxiety    Asthma    Hypertension    Migraine    Morphea    Obesity    Thyroid disease      Home Medications Prior to Admission medications   Medication Sig Start Date End Date Taking? Authorizing Provider  acetaminophen (TYLENOL) 500 MG tablet Take 500 mg by mouth every 6 (six) hours as needed for mild pain, moderate pain or headache.    [provider]  amLODipine (NORVASC) 10 MG tablet Take 1 tablet (10 mg total) by mouth daily for 30 days. 03/05/19 05/16/21  McDonald, Mia A, PA-C  hydrochlorothiazide (HYDRODIURIL) 25 MG tablet Take 1 tablet (25 mg total) by mouth daily for 30 days. 03/05/19 05/16/21  McDonald, Mia A, PA-C  levothyroxine (SYNTHROID) 125 MCG tablet Take 1 tablet (125 mcg total) by mouth daily for 30 days. 03/05/19 05/16/21  McDonald, Mia A, PA-C   losartan (COZAAR) 50 MG tablet Take 1 tablet (50 mg total) by mouth daily for 30 days. 03/05/19 05/16/21  McDonald, Mia A, PA-C  PROAIR HFA 108 (90 BASE) MCG/ACT inhaler Inhale 2 puffs into the lungs daily as needed for wheezing or shortness of breath.  10/29/14   [provider]  Vitamin D, Ergocalciferol, (DRISDOL) 1.25 MG (50000 UNIT) CAPS capsule Take 50,000 Units by mouth once a week. On Mondays 04/13/20   [provider]      Allergies    Patient has no known allergies.    Review of Systems   Review of Systems  Physical Exam Updated Vital Signs BP (!) 199/139 (BP Location: Right Wrist)   Pulse (!) 107   Temp 98.3 F (36.8 C) (Oral)   Resp 20   Ht '5\' 4"'$  (1.626 m)   Wt (!) 169.5 kg   SpO2 96%   BMI 64.15 kg/m  Physical Exam Vitals and nursing note reviewed.  Constitutional:      General: She is not in acute distress.    Appearance: She is well-developed. She is not diaphoretic.  HENT:     Head: Normocephalic and atraumatic.  Eyes:     Conjunctiva/sclera: Conjunctivae normal.  Neck:     Vascular: JVD present.  Cardiovascular:     Rate and Rhythm: Normal rate and regular rhythm.     Heart sounds: Normal  heart sounds. No murmur heard.    No friction rub. No gallop.  Pulmonary:     Effort: Pulmonary effort is normal. No respiratory distress.     Breath sounds: Wheezing (occasional) and rales present.  Abdominal:     General: There is no distension.     Palpations: Abdomen is soft.     Tenderness: There is no abdominal tenderness. There is no guarding.  Musculoskeletal:        General: No tenderness.     Cervical back: Normal range of motion.  Skin:    General: Skin is warm and dry.     Findings: No erythema or rash.  Neurological:     Mental Status: She is alert and oriented to person, place, and time.     ED Results / Procedures / Treatments   Labs (all labs ordered are listed, but only abnormal results are displayed) Labs Reviewed   COMPREHENSIVE METABOLIC PANEL - Abnormal; Notable for the following components:      Result Value   Potassium 2.7 (*)    Glucose, Bld 103 (*)    Creatinine, Ser 1.03 (*)    AST 12 (*)    All other components within normal limits  CBC WITH DIFFERENTIAL/PLATELET - Abnormal; Notable for the following components:   WBC 11.6 (*)    Hemoglobin 8.3 (*)    HCT 28.2 (*)    MCV 68.8 (*)    MCH 20.2 (*)    MCHC 29.4 (*)    RDW 19.8 (*)    Neutro Abs 9.5 (*)    All other components within normal limits  URINALYSIS, ROUTINE W REFLEX MICROSCOPIC - Abnormal; Notable for the following components:   Protein, ur 30 (*)    Nitrite POSITIVE (*)    Leukocytes,Ua TRACE (*)    Bacteria, UA MANY (*)    All other components within normal limits  BRAIN NATRIURETIC PEPTIDE - Abnormal; Notable for the following components:   B Natriuretic Peptide 443.5 (*)    All other components within normal limits  SARS CORONAVIRUS 2 BY RT PCR  CULTURE, BLOOD (ROUTINE X 2)  CULTURE, BLOOD (ROUTINE X 2)  LACTIC ACID, PLASMA  PROTIME-INR  MAGNESIUM  PREGNANCY, URINE  VITAMIN B12  FOLATE  IRON AND TIBC  FERRITIN  RETICULOCYTES  HIV ANTIBODY (ROUTINE TESTING W REFLEX)  BASIC METABOLIC PANEL  TSH  HEMOGLOBIN A1C  CBC  TROPONIN I (HIGH SENSITIVITY)  TROPONIN I (HIGH SENSITIVITY)    EKG EKG Interpretation  Date/Time:  Friday August 11 2022 08:14:19 EDT Ventricular Rate:  105 PR Interval:  140 QRS Duration: 92 QT Interval:  392 QTC Calculation: 518 R Axis:   16 Text Interpretation: Sinus tachycardia Moderate voltage criteria for LVH, may be normal variant ( R in aVL , Cornell product ) Cannot rule out Anterior infarct , age undetermined ST & T wave abnormality, consider lateral ischemia Abnormal ECG Similar TW changes to prior Confirmed by Gareth Morgan [phone]) on 08/11/2022 8:45:51 AM  Radiology DG Chest 2 View  Result Date: 08/11/2022 CLINICAL DATA:  Evaluate for sepsis. EXAM: CHEST - 2 VIEW  COMPARISON:  05/24/2021 FINDINGS: There is mild cardiac enlargement. Trace pleural fluid noted with thickening of the fissures and blunting of the costophrenic angles. Pulmonary vascular congestion. No airspace consolidation. Visualized osseous structures are unremarkable. IMPRESSION: 1. Mild cardiac enlargement and pulmonary vascular congestion. 2. Suspect trace pleural fluid bilaterally. Electronically Signed   By: Kerby Moors M.D.   On: 08/11/2022 08:41  Procedures Procedures    Medications Ordered in ED Medications  potassium chloride SA (KLOR-CON M) CR tablet 40 mEq (40 mEq Oral Given 08/11/22 2257)  nitroGLYCERIN (NITROSTAT) SL tablet 0.4 mg (has no administration in time range)  acetaminophen (TYLENOL) tablet 1,000 mg (1,000 mg Oral Given 08/11/22 1848)  labetalol (NORMODYNE) injection 20 mg (has no administration in time range)  sodium chloride flush (NS) 0.9 % injection 3 mL (3 mLs Intravenous Given 08/11/22 2257)  sodium chloride flush (NS) 0.9 % injection 3 mL (has no administration in time range)  0.9 %  sodium chloride infusion (has no administration in time range)  ondansetron (ZOFRAN) injection 4 mg (has no administration in time range)  enoxaparin (LOVENOX) injection 40 mg (has no administration in time range)  furosemide (LASIX) injection 40 mg (has no administration in time range)  amLODipine (NORVASC) tablet 10 mg (has no administration in time range)  losartan (COZAAR) tablet 50 mg (has no administration in time range)  albuterol (PROVENTIL) (2.5 MG/3ML) 0.083% nebulizer solution 2.5 mg (has no administration in time range)  mometasone-formoterol (DULERA) 200-5 MCG/ACT inhaler 2 puff (2 puffs Inhalation Not Given 08/11/22 2234)  ipratropium-albuterol (DUONEB) 0.5-2.5 (3) MG/3ML nebulizer solution 3 mL (3 mLs Nebulization Given 08/11/22 0815)  labetalol (NORMODYNE) injection 10 mg (10 mg Intravenous Given 08/11/22 1409)  ipratropium-albuterol (DUONEB) 0.5-2.5 (3) MG/3ML  nebulizer solution 3 mL (3 mLs Nebulization Given 08/11/22 1415)  furosemide (LASIX) injection 20 mg (20 mg Intravenous Given 08/11/22 1456)  potassium chloride 10 mEq in 100 mL IVPB (0 mEq Intravenous Stopped 08/11/22 1854)  labetalol (NORMODYNE) injection 20 mg (20 mg Intravenous Given 08/11/22 1621)  amLODipine (NORVASC) tablet 10 mg (10 mg Oral Given 08/11/22 1604)  cloNIDine (CATAPRES) tablet 0.1 mg (0.1 mg Oral Given 08/11/22 1720)  predniSONE (DELTASONE) tablet 60 mg (60 mg Oral Given 08/11/22 1750)  furosemide (LASIX) injection 20 mg (20 mg Intravenous Given 08/11/22 1751)    ED Course/ Medical Decision Making/ A&P                            36 year old female with a history of asthma and hypertension presenting concern for shortness of breath.  Differential diagnosis for dyspnea includes ACS, PE, COPD exacerbation, CHF exacerbation, anemia, pneumonia, viral etiology such as COVID 19 infection, metabolic abnormality.  Chest x-ray was done which showed pleural effusions, pulmonary vascular congestion, cardiomegaly.Marland Kitchen EKG was evaluated by me which showed similar T wave changes compared to prior, concern for LVH.Marland Kitchen  BNP was elevated to the 400s..  Troponin negative.  He has occasional wheezing on exam, and findings concerning for fluid overload with JVD, crackles noted on exam if likely BNP added pulmonary vascular congestion.  Also note hypertension and has not taken her blood pressure medications today.  Suspect hypertensive urgency, new diagnosis of heart failure as contributor to dyspnea.  She is on room air, but has tachypnea, feel inpatient evaluation is appropriate.  She is given labetalol 10 mg, given additional labetalol.  Given '20mg'$  lasix given naive, significant hypokalemia on labs.  IV K ordered.  Care signed out with plan for admission with monitoring at this time to decide level of care in discussion with hospitalist.     1      Final Clinical Impression(s) / ED Diagnoses Final  diagnoses:  Hypertensive emergency  Congestive heart failure, unspecified HF chronicity, unspecified heart failure type [facility])    Rx / DC Orders ED Discharge  Orders     None         Gareth Morgan, MD 08/11/22 2357

## 2022-08-11 NOTE — Assessment & Plan Note (Addendum)
Estimated body mass index is 63.99 kg/m as calculated from the following:   Height as of this encounter: '5\' 4"'$  (1.626 m).   Weight as of this encounter: 169.1 kg.

## 2022-08-11 NOTE — ED Triage Notes (Signed)
Pt presents POV from home with SOB x3 days. Hx of asthma, using her home inhaler with no relief.  RT in triage, albuterol treatment given in triage

## 2022-08-11 NOTE — Progress Notes (Signed)
Plan of Care Note for accepted transfer   Patient: DEMMI SINDT MRN: [id number]   [place]: 08/11/2022  Facility requesting transfer: [facility] Requesting Provider: EDP Reason for transfer: hypertension emergency, flush pulmonary edema, ? Asthma exacerbation? Facility course:  Blood pressure (!) 183/118, pulse 100, temperature 98 F (36.7 C), temperature source Oral, resp. rate (!) 30, SpO2 94 % on room air  5yrwith h/o asthma, HTN, presents to the ED with sob/wheezing, reports Was at friends house with cats, bp elevated,  cxr 1. Mild cardiac enlargement and pulmonary vascular congestion. 2. Suspect trace pleural fluid bilaterally. Bnp elevated, negative troponin EDP give iv lasix '20mg'$  iv x1, bp meds, reports bp is better, less tachypnea, talking in full sentences , requesting admission for bp control , heart failure work up including echo, I have requested additional dose of ix lasix, steroid coverage for possible asthma exacerbation, she also has hypokalemia, EPD to replace k orally if able to take oral meds     Plan of care: The patient [facility]    Author: FFlorencia Reasons MD PhD FACP 08/11/2022  Check [email or web address] for on-call coverage.  Nursing staff, Please call [email or web address] on Amion as soon as patient's arrival, so appropriate admitting provider can evaluate the pt.

## 2022-08-11 NOTE — Assessment & Plan Note (Addendum)
Uncontrolled HTN likely as cause of acute CHF. Hadnt been on any BP meds for many months. 1. PRN labetalol IV 2. Amlodipine '10mg'$  po daily (got in ED) 3. Will re-start cozaar as well which she used to be on 4. Holding of on restarting thiazide for the moment since we are doing lasix

## 2022-08-11 NOTE — Assessment & Plan Note (Signed)
Not currently on treatment. TSH normal. Outpatient PCP follow-up.

## 2022-08-12 ENCOUNTER — Observation Stay (HOSPITAL_BASED_OUTPATIENT_CLINIC_OR_DEPARTMENT_OTHER): Payer: Medicaid Other

## 2022-08-12 ENCOUNTER — Telehealth: Payer: Self-pay | Admitting: Physician Assistant

## 2022-08-12 DIAGNOSIS — E876 Hypokalemia: Secondary | ICD-10-CM

## 2022-08-12 DIAGNOSIS — J45901 Unspecified asthma with (acute) exacerbation: Secondary | ICD-10-CM | POA: Diagnosis not present

## 2022-08-12 DIAGNOSIS — I5031 Acute diastolic (congestive) heart failure: Secondary | ICD-10-CM

## 2022-08-12 LAB — BASIC METABOLIC PANEL
Anion gap: 15 (ref 5–15)
BUN: 9 mg/dL (ref 6–20)
CO2: 20 mmol/L — ABNORMAL LOW (ref 22–32)
Calcium: 9 mg/dL (ref 8.9–10.3)
Chloride: 103 mmol/L (ref 98–111)
Creatinine, Ser: 1.07 mg/dL — ABNORMAL HIGH (ref 0.44–1.00)
GFR, Estimated: >60 mL/min (ref >60)
Glucose, Bld: 152 mg/dL — ABNORMAL HIGH (ref 70–99)
Potassium: 3.3 mmol/L — ABNORMAL LOW (ref 3.5–5.1)
Sodium: 138 mmol/L (ref 135–145)

## 2022-08-12 LAB — ECHOCARDIOGRAM COMPLETE
AR max vel: 2.07 cm2
AV Area VTI: 2.08 cm2
AV Area mean vel: 1.95 cm2
AV Mean grad: 4 mmHg
AV Peak grad: 8.3 mmHg
Ao pk vel: 1.44 m/s
Area-P 1/2: 2.39 cm2
Height: 64 in
S' Lateral: 4.5 cm
Weight: 5964.8 oz

## 2022-08-12 LAB — CBC
HCT: 30.1 % — ABNORMAL LOW (ref 36.0–46.0)
Hemoglobin: 8.9 g/dL — ABNORMAL LOW (ref 12.0–15.0)
MCH: 20.4 pg — ABNORMAL LOW (ref 26.0–34.0)
MCHC: 29.6 g/dL — ABNORMAL LOW (ref 30.0–36.0)
MCV: 68.9 fL — ABNORMAL LOW (ref 80.0–100.0)
Platelets: 263 10*3/uL (ref 150–400)
RBC: 4.37 MIL/uL (ref 3.87–5.11)
RDW: 19.5 % — ABNORMAL HIGH (ref 11.5–15.5)
WBC: 12.7 10*3/uL — ABNORMAL HIGH (ref 4.0–10.5)
nRBC: 0 % (ref 0.0–0.2)

## 2022-08-12 LAB — IRON AND TIBC
Iron: 18 ug/dL — ABNORMAL LOW (ref 28–170)
Saturation Ratios: 5 % — ABNORMAL LOW (ref 10.4–31.8)
TIBC: 336 ug/dL (ref 250–450)
UIBC: 318 ug/dL

## 2022-08-12 LAB — FOLATE: Folate: 7.7 ng/mL (ref >5.9)

## 2022-08-12 LAB — VITAMIN B12: Vitamin B-12: 239 pg/mL (ref 180–914)

## 2022-08-12 LAB — RETICULOCYTES
Immature Retic Fract: 23.8 % — ABNORMAL HIGH (ref 2.3–15.9)
RBC.: 4.25 MIL/uL (ref 3.87–5.11)
Retic Count, Absolute: 78.2 10*3/uL (ref 19.0–186.0)
Retic Ct Pct: 1.8 % (ref 0.4–3.1)

## 2022-08-12 LAB — TSH: TSH: 4.284 u[IU]/mL (ref 0.350–4.500)

## 2022-08-12 LAB — FERRITIN: Ferritin: 14 ng/mL (ref 11–307)

## 2022-08-12 LAB — HIV ANTIBODY (ROUTINE TESTING W REFLEX): HIV Screen 4th Generation wRfx: NONREACTIVE

## 2022-08-12 LAB — HEMOGLOBIN A1C
Hgb A1c MFr Bld: 5.1 % (ref 4.8–5.6)
Mean Plasma Glucose: 99.67 mg/dL

## 2022-08-12 MED ORDER — CLONIDINE HCL 0.1 MG PO TABS
0.1000 mg | ORAL_TABLET | Freq: Two times a day (BID) | ORAL | Status: DC
  Administered 2022-08-12: 0.1 mg via ORAL
  Filled 2022-08-12: qty 1

## 2022-08-12 MED ORDER — CARVEDILOL 12.5 MG PO TABS
12.5000 mg | ORAL_TABLET | Freq: Two times a day (BID) | ORAL | Status: DC
  Administered 2022-08-12: 12.5 mg via ORAL
  Filled 2022-08-12: qty 1

## 2022-08-12 MED ORDER — FERROUS SULFATE 325 (65 FE) MG PO TABS: 325.0000 mg | ORAL_TABLET | Freq: Every day | ORAL | 2 refills | Status: AC

## 2022-08-12 MED ORDER — CARVEDILOL 12.5 MG PO TABS: 12.5000 mg | ORAL_TABLET | Freq: Two times a day (BID) | ORAL | 2 refills | Status: DC

## 2022-08-12 NOTE — Discharge Instructions (Addendum)
Wendy Humphrey,  You were in the hospital with trouble breathing. This appears to have been related to your asthma, but we also found out that you have evidence of heart failure. You will need continued workup for this. Please follow-up with our cardiologists as an outpatient; I will place a referral and send them a message for your follow-up. Please take your medications as prescribed. To keep your heart pumping well, please limit your fluid intake to no more than about 1.5 liters per day and follow a low sodium diet (less than 2000 mg per day); high salt foods include fast food (especially chicken), canned food and other pre-prepared/processed foods. If you develop worsening breathing, please return immediately for reevaluation.

## 2022-08-12 NOTE — Assessment & Plan Note (Signed)
Patient treated with potassium supplementation. Continue potassium supplementation on discharge. Repeat BMP as an outpatient.

## 2022-08-12 NOTE — Discharge Summary (Signed)
Physician Discharge Summary   Patient: Wendy Humphrey MRN: [id number] DOB: 1986-01-27  Admit date:     08/11/2022  Discharge date: 08/12/22  Discharge Physician: Cordelia Poche, MD   [facility]P: [facility]   Recommendations at discharge:  Heart failure team follow-up PCP follow-up Continue antihypertensives  Discharge Diagnoses: Principal Problem:   Asthma, chronic, unspecified asthma severity, with acute exacerbation Active Problems:   Malignant hypertension   Acute systolic (congestive) heart failure (HCC)   Hypothyroidism   Morbid obesity with body mass index of 60.0-69.9 in adult ([place])   Anemia   Hypokalemia  Resolved Problems:   * No resolved hospital problems. *  Hospital Course:  * Asthma, chronic, unspecified asthma severity, with acute exacerbation Possibly secondary to exposure. Patient managed on prednisone and breathing treatments with improvement in symptoms. No hypoxia. patient ambulated without subsequent hypoxia or dyspnea. Continue albuterol.   Acute systolic (congestive) heart failure (HCC) Elevated BNP of 443.5 with no clinical symptoms of heart failure. Patient empirically treated with Lasix IV. Transthoracic Echocardiogram obtained and was significant for an LVEF of 40-45% with global hypokinesis and severe ventricular hypertrophy. Discussed case with cardiology who recommended non-urgent outpatient cardiology/heart failure follow-up and consideration of a cardiac MRI. Continue losartan. Adding Coreg and Lasix. Discussed fluid restriction and limited salt intake.  Malignant hypertension Chronic issue. Patient follows with her PCP and has had her regimen adjusted. With new diagnosis of heart failure, will adjust medications. Continue amlodipine, losartan, clonidine and hydrochlorothiazide. Add Coreg.  Anemia Chronic. In setting of menstruating female. Stable. Iron panel somewhat consistent with iron deficiency. Discharge with iron  supplementation.  Morbid obesity with body mass index of 60.0-69.9 in adult [facility]) Estimated body mass index is 63.99 kg/m as calculated from the following:   Height as of this encounter: '5\' 4"'$  (1.626 m).   Weight as of this encounter: 169.1 kg.  Hypothyroidism Not currently on treatment. TSH normal. Outpatient PCP follow-up.  Hypokalemia Patient treated with potassium supplementation. Continue potassium supplementation on discharge. Repeat BMP as an outpatient.   Consultants: Cardiology (phone) Procedures performed: Transthoracic Echocardiogram   Disposition: Home Diet recommendation: Low sodium/cardiac diet  DISCHARGE MEDICATION: Allergies as of 08/12/2022   No Known Allergies      Medication List     STOP taking these medications    acetaminophen-codeine 300-30 MG tablet Commonly known as: TYLENOL #3   HYDROcodone-acetaminophen 7.5-325 MG tablet Commonly known as: NORCO   olmesartan-hydrochlorothiazide 20-12.5 MG tablet Commonly known as: BENICAR HCT       TAKE these medications    acetaminophen 500 MG tablet Commonly known as: TYLENOL Take 1,000 mg by mouth daily as needed for mild pain, moderate pain or headache.   amLODipine 10 MG tablet Commonly known as: NORVASC Take 1 tablet (10 mg total) by mouth daily for 30 days.   carvedilol 12.5 MG tablet Commonly known as: COREG Take 1 tablet (12.5 mg total) by mouth 2 (two) times daily with a meal.   cloNIDine 0.1 MG tablet Commonly known as: CATAPRES Take 0.1 mg by mouth 2 (two) times daily.   ferrous sulfate 325 (65 FE) MG tablet Take 1 tablet (325 mg total) by mouth daily with breakfast.   hydrochlorothiazide 25 MG tablet Commonly known as: HYDRODIURIL Take 1 tablet (25 mg total) by mouth daily for 30 days.   Klor-Con 20 MEQ packet Generic drug: potassium chloride Take 20 mEq by mouth daily.   levothyroxine 150 MCG tablet Commonly known as: SYNTHROID Take 150  mcg by mouth daily. What changed:  Another medication with the same name was removed. Continue taking this medication, and follow the directions you see here.   levothyroxine 25 MCG tablet Commonly known as: SYNTHROID Take by mouth. What changed: Another medication with the same name was removed. Continue taking this medication, and follow the directions you see here.   losartan 50 MG tablet Commonly known as: COZAAR Take 1 tablet (50 mg total) by mouth daily for 30 days.   OVER THE COUNTER MEDICATION Take 2 capsules by mouth daily. Sea Moss Capsule   OVER THE COUNTER MEDICATION Take 2 capsules by mouth daily. Beet Root   ProAir HFA 108 (90 Base) MCG/ACT inhaler Generic drug: albuterol Inhale 2 puffs into the lungs daily as needed for wheezing or shortness of breath.   Vitamin D (Ergocalciferol) 1.25 MG (50000 UNIT) Caps capsule Commonly known as: DRISDOL Take 50,000 Units by mouth once a week. On Mondays        Discharge Exam: BP (!) 173/109 (BP Location: Left Wrist)   Pulse (!) 102   Temp 97.9 F (36.6 C) (Oral)   Resp 18   Ht '5\' 4"'$  (1.626 m)   Wt (!) 169.1 kg   SpO2 98%   BMI 63.99 kg/m   General exam: Appears calm and comfortable Respiratory system: Clear to auscultation. No rales or wheezing. Good air movement. Respiratory effort normal. Cardiovascular system: S1 & S2 heard, RRR. No murmurs, rubs, gallops or clicks. Gastrointestinal system: Abdomen is nondistended, soft and nontender. Normal bowel sounds heard. Central nervous system: Alert and oriented. No focal neurological deficits. Musculoskeletal: No edema. No calf tenderness Skin: No cyanosis. No rashes Psychiatry: Judgement and insight appear normal. Mood & affect appropriate.   Condition at discharge: stable  The results of significant diagnostics from this hospitalization (including imaging, microbiology, ancillary and laboratory) are listed below for reference.   Imaging Studies: DG Chest 2 View  Result Date: 08/11/2022 CLINICAL  DATA:  Evaluate for sepsis. EXAM: CHEST - 2 VIEW COMPARISON:  05/24/2021 FINDINGS: There is mild cardiac enlargement. Trace pleural fluid noted with thickening of the fissures and blunting of the costophrenic angles. Pulmonary vascular congestion. No airspace consolidation. Visualized osseous structures are unremarkable. IMPRESSION: 1. Mild cardiac enlargement and pulmonary vascular congestion. 2. Suspect trace pleural fluid bilaterally. Electronically Signed   By: Kerby Moors M.D.   On: 08/11/2022 08:41    Microbiology: Results for orders placed or performed during the hospital encounter of 08/11/22  SARS Coronavirus 2 by RT PCR (hospital order, performed in [facility] hospital lab) *cepheid single result test* Anterior Nasal Swab     Status: None   Collection Time: 08/11/22  8:29 AM   Specimen: Anterior Nasal Swab  Result Value Ref Range Status   SARS Coronavirus 2 by RT PCR NEGATIVE NEGATIVE Final    Comment: (NOTE) SARS-CoV-2 target nucleic acids are NOT DETECTED.  The SARS-CoV-2 RNA is generally detectable in upper and lower respiratory specimens during the acute phase of infection. The lowest concentration of SARS-CoV-2 viral copies this assay can detect is 250 copies / mL. A negative result does not preclude SARS-CoV-2 infection and should not be used as the sole basis for treatment or other patient management decisions.  A negative result may occur with improper specimen collection / handling, submission of specimen other than nasopharyngeal swab, presence of viral mutation(s) within the areas targeted by this assay, and inadequate number of viral copies (<250 copies / mL). A negative result  must be combined with clinical observations, patient history, and epidemiological information.  Fact Sheet for Patients:   https://www.patel.info/  Fact Sheet for Healthcare Providers: https://hall.com/  This test is not yet approved or  cleared  by the [place] FDA and has been authorized for detection and/or diagnosis of SARS-CoV-2 by FDA under an Emergency Use Authorization (EUA).  This EUA will remain in effect (meaning this test can be used) for the duration of the COVID-19 declaration under Section 564(b)(1) of the Act, 21 U.S.C. section 360bbb-3(b)(1), unless the authorization is terminated or revoked sooner.  Performed at [facility], [address], [place], [place] [address]   Culture, blood (Routine x 2)     Status: None (Preliminary result)   Collection Time: 08/11/22 11:38 AM   Specimen: BLOOD  Result Value Ref Range Status   Specimen Description   Final    BLOOD LEFT ANTECUBITAL Performed at [facility] [address] [place], [place] [address]    Special Requests   Final    BOTTLES DRAWN AEROBIC AND ANAEROBIC Blood Culture adequate volume Performed at [facility], [address] [place], [place] [address]    Culture   Final    NO GROWTH < 24 HOURS Performed at [facility], [place] [address] [place], [place] [address]    Report Status PENDING  Incomplete    Labs: CBC: Recent Labs  Lab 08/11/22 1138 08/12/22 0218  WBC 11.6* 12.7*  NEUTROABS 9.5*  --   HGB 8.3* 8.9*  HCT 28.2* 30.1*  MCV 68.8* 68.9*  PLT 240 209   Basic Metabolic Panel: Recent Labs  Lab 08/11/22 1138 08/12/22 0218  NA 140 138  K 2.7* 3.3*  CL 101 103  CO2 27 20*  GLUCOSE 103* 152*  BUN 11 9  CREATININE 1.03* 1.07*  CALCIUM 8.9 9.0  MG 1.9  --    Liver Function Tests: Recent Labs  Lab 08/11/22 1138  AST 12*  ALT 11  ALKPHOS 70  BILITOT 0.8  PROT 7.7  ALBUMIN 3.9   Discharge time spent: 35 minutes.  Signed: Cordelia Poche, MD [facility] 08/12/2022

## 2022-08-12 NOTE — Progress Notes (Signed)
Ambulated in hallway on RA. Sats remained in 90's with ambulation x 100'. Denies SOB or other c/o.

## 2022-08-12 NOTE — Progress Notes (Signed)
  Echocardiogram 2D Echocardiogram has been performed.  Wendy Humphrey 08/12/2022, 3:56 PM

## 2022-08-12 NOTE — Telephone Encounter (Signed)
Dr. Domenic Polite is covering hospital cardiology today and spoke with medicine team about this patient. He requests this patient get early follow-up with the Advanced Heart Failure for abnormal echo (EF 40-45%, cannot exclude HCM versus infiltrate CM), will send a message to their team to arrange and call patient with this information.

## 2022-08-15 NOTE — Telephone Encounter (Signed)
Message sent to scheduling team

## 2022-08-16 LAB — CULTURE, BLOOD (ROUTINE X 2)
Culture: NO GROWTH
Special Requests: ADEQUATE

## 2022-08-22 ENCOUNTER — Encounter (HOSPITAL_COMMUNITY): Payer: Self-pay | Admitting: Cardiology

## 2022-08-22 ENCOUNTER — Other Ambulatory Visit (HOSPITAL_COMMUNITY): Payer: Self-pay

## 2022-08-22 ENCOUNTER — Ambulatory Visit (HOSPITAL_COMMUNITY)
Admission: RE | Admit: 2022-08-22 | Discharge: 2022-08-22 | Disposition: A | Discharge status: Home / Self Care | Payer: Medicaid Other | Source: Ambulatory Visit | Attending: Cardiology | Admitting: Cardiology

## 2022-08-22 VITALS — BP 170/90 | HR 82 | Wt 367.4 lb

## 2022-08-22 DIAGNOSIS — I10 Essential (primary) hypertension: Secondary | ICD-10-CM

## 2022-08-22 DIAGNOSIS — Z6841 Body Mass Index (BMI) 40.0 and over, adult: Secondary | ICD-10-CM

## 2022-08-22 DIAGNOSIS — E669 Obesity, unspecified: Secondary | ICD-10-CM | POA: Diagnosis not present

## 2022-08-22 DIAGNOSIS — I5022 Chronic systolic (congestive) heart failure: Secondary | ICD-10-CM | POA: Diagnosis not present

## 2022-08-22 DIAGNOSIS — I110 Hypertensive heart disease with heart failure: Secondary | ICD-10-CM | POA: Insufficient documentation

## 2022-08-22 DIAGNOSIS — Z7984 Long term (current) use of oral hypoglycemic drugs: Secondary | ICD-10-CM | POA: Diagnosis not present

## 2022-08-22 DIAGNOSIS — Z79899 Other long term (current) drug therapy: Secondary | ICD-10-CM | POA: Insufficient documentation

## 2022-08-22 DIAGNOSIS — J45909 Unspecified asthma, uncomplicated: Secondary | ICD-10-CM | POA: Diagnosis not present

## 2022-08-22 LAB — COMPREHENSIVE METABOLIC PANEL
ALT: 11 U/L (ref 0–44)
AST: 12 U/L — ABNORMAL LOW (ref 15–41)
Albumin: 3.4 g/dL — ABNORMAL LOW (ref 3.5–5.0)
Alkaline Phosphatase: 71 U/L (ref 38–126)
Anion gap: 7 (ref 5–15)
BUN: 15 mg/dL (ref 6–20)
CO2: 29 mmol/L (ref 22–32)
Calcium: 8.9 mg/dL (ref 8.9–10.3)
Chloride: 102 mmol/L (ref 98–111)
Creatinine, Ser: 1.15 mg/dL — ABNORMAL HIGH (ref 0.44–1.00)
GFR, Estimated: >60 mL/min (ref >60)
Glucose, Bld: 102 mg/dL — ABNORMAL HIGH (ref 70–99)
Potassium: 2.8 mmol/L — ABNORMAL LOW (ref 3.5–5.1)
Sodium: 138 mmol/L (ref 135–145)
Total Bilirubin: 0.7 mg/dL (ref 0.3–1.2)
Total Protein: 7.6 g/dL (ref 6.5–8.1)

## 2022-08-22 LAB — BRAIN NATRIURETIC PEPTIDE: B Natriuretic Peptide: 78.4 pg/mL (ref 0.0–100.0)

## 2022-08-22 MED ORDER — DAPAGLIFLOZIN PROPANEDIOL 10 MG PO TABS: 10.0000 mg | ORAL_TABLET | Freq: Every day | ORAL | 6 refills | Status: DC

## 2022-08-22 MED ORDER — ENTRESTO 97-103 MG PO TABS: 1.0000 | ORAL_TABLET | Freq: Two times a day (BID) | ORAL | 6 refills | Status: DC

## 2022-08-22 MED ORDER — SPIRONOLACTONE 25 MG PO TABS: 12.5000 mg | ORAL_TABLET | Freq: Every day | ORAL | 3 refills | Status: DC

## 2022-08-22 NOTE — Progress Notes (Signed)
ADVANCED HEART FAILURE CLINIC NOTE  Referring Physician: [facility], [place]  Primary Care: [facility], [place] Primary Cardiologist:  HPI: Wendy Humphrey is a 36 y.o. female with history of asthma, uncontrolled HTN, obesity and recently diagnosed systolic heart failure presenting today to establish care. According to Wendy Humphrey, she was in her usual state of health in late September 2023, when she began to feeling worsening SOB/chest pressure. She initially believed it to be secondary to her asthma, however, when symptoms did not improve she came to [facility] where she was diagnosed with HFrEF w/ LVEF 45-50%. She was started on low doses of GDMT and discharged home.   Interval hx:  Since discharge, Wendy Humphrey has done fairly well. She reports improvement in her SOB and has not had any further episodes of PND/orthopnea. Unfortunately her BP continues to remain severely elevated.    Past Medical History:  Diagnosis Date   Anxiety    Asthma    Hypertension    Migraine    Morphea    Obesity    Thyroid disease     Current Outpatient Medications  Medication Sig Dispense Refill   acetaminophen (TYLENOL) 500 MG tablet Take 1,000 mg by mouth daily as needed for mild pain, moderate pain or headache.     amLODipine (NORVASC) 10 MG tablet Take 10 mg by mouth daily.     carvedilol (COREG) 12.5 MG tablet Take 1 tablet (12.5 mg total) by mouth 2 (two) times daily with a meal. 60 tablet 2   dapagliflozin propanediol (FARXIGA) 10 MG TABS tablet Take 1 tablet (10 mg total) by mouth daily before breakfast. 30 tablet 6   ferrous sulfate 325 (65 FE) MG tablet Take 1 tablet (325 mg total) by mouth daily with breakfast. 30 tablet 2   hydrochlorothiazide (HYDRODIURIL) 25 MG tablet Take 1 tablet (25 mg total) by mouth daily for 30 days. 30 tablet 1   levothyroxine (SYNTHROID) 150 MCG tablet Take 150 mcg by mouth daily.     levothyroxine (SYNTHROID) 25 MCG tablet Take 12.5 mcg by mouth  daily before breakfast.     OVER THE COUNTER MEDICATION Take 2 capsules by mouth daily. Sea Moss Capsule     OVER THE COUNTER MEDICATION Take 2 capsules by mouth daily. Beet Root     potassium chloride SA (KLOR-CON M) 20 MEQ tablet Take 20 mEq by mouth daily.     PROAIR HFA 108 (90 BASE) MCG/ACT inhaler Inhale 2 puffs into the lungs daily as needed for wheezing or shortness of breath.  0   sacubitril-valsartan (ENTRESTO) 97-103 MG Take 1 tablet by mouth 2 (two) times daily. 60 tablet 6   spironolactone (ALDACTONE) 25 MG tablet Take 0.5 tablets (12.5 mg total) by mouth daily. 15 tablet 3   Vitamin D, Ergocalciferol, (DRISDOL) 1.25 MG (50000 UNIT) CAPS capsule Take 50,000 Units by mouth once a week. On Mondays     No current facility-administered medications for this encounter.    No Known Allergies    Social History   Socioeconomic History   Marital status: Single    Spouse name: Not on file   Number of children: Not on file   Years of education: Not on file   Highest education level: Not on file  Occupational History   Not on file  Tobacco Use   Smoking status: Never   Smokeless tobacco: Never  Vaping Use   Vaping Use: Never used  Substance and Sexual Activity  Alcohol use: No   Drug use: No   Sexual activity: Yes    Birth control/protection: None  Other Topics Concern   Not on file  Social History Narrative   Not on file   Social Determinants of Health   Financial Resource Strain: Not on file  Food Insecurity: Not on file  Transportation Needs: Not on file  Physical Activity: Not on file  Stress: Not on file  Social Connections: Not on file  Intimate Partner Violence: Not on file     History reviewed. No pertinent family history.  PHYSICAL EXAM: Vitals:   08/22/22 0910  BP: (!) 170/90  Pulse: 82  SpO2: 98%   GENERAL: Overweight AAF, comfortable; NAD  HEENT: Negative for arcus senilis or xanthelasma. There is no scleral icterus.  The mucous membranes are  pink and moist.   NECK: Supple, No masses. Normal carotid upstrokes without bruits. No masses or thyromegaly.    CHEST: There are no chest wall deformities. There is no chest wall tenderness. Respirations are unlabored.  Lungs- CTA B/L; no crackles CARDIAC:  JVP: <8 cm H2O         Normal S1, S2  Normal rate with regular rhythm. No murmurs, rubs or gallops.  Pulses are 2+ and symmetrical in upper and lower extremities. 1+ edema.  ABDOMEN: Soft, non-tender, non-distended. There are no masses or hepatomegaly. There are normal bowel sounds.  EXTREMITIES: Warm and well perfused with no cyanosis, clubbing.  LYMPHATIC: No axillary or supraclavicular lymphadenopathy.  NEUROLOGIC: Patient is oriented x3 with no focal or lateralizing neurologic deficits.  PSYCH: Patients affect is appropriate, there is no evidence of anxiety or depression.  SKIN: Warm and dry; no lesions or wounds.   DATA REVIEW  ECG:NSR  ECHO:  08/12/22: LVEF 50%, LVH.   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  NYHA class / AHA Stage:IIB Volume status & Diuretics: Euvolemic to mildly hypervolemic Vasodilators:D/C losartan, D/C clonidine. Start Entresto 97/103 BID Beta-Blocker:continue coreg, will plan to uptitrate on follow up.  JQD:UKRCV spironolactone 12.'5mg'$   Cardiometabolic: start farxiga '10mg'$   Devices therapies & Valvulopathies:Not indicated at this time; no valvular disease Advanced therapies:Not indicated; working on weight loss  2. Obesity  - Discussed bariatric surgery  - Referral information provided   3. HTN - Entresto 97/103 BID starting today; D/C clonidine & losartan - On follow up will plan to increase coreg and D/C amlodipine and/or HCTZ.    Follow-up:  80m2 weeks with pharmacy  Wendy Humphrey Advanced Heart  Failure Mechanical Circulatory Support

## 2022-08-22 NOTE — Patient Instructions (Addendum)
Medication Changes:  STOP Losartan  STOP Clonidine  START Entresto 97/103 mg Twice daily   START Spironolactone 12.5 mg (1/2 tab) Daily  START Farxiga 10 mg Daily  Your provider has prescribed Farxiga for you. Please be aware the most common side effect of this medication is urinary tract infections and yeast infections. Please practice good hygiene and keep this area clean and dry to help prevent this. If you do begin to have symptoms of these infections, such as difficulty urinating or painful urination,  please let us know.  Lab Work:  none  Testing/Procedures:  none  Referrals:  Please see attached flyer and register for a Weight Loss Seminar  Special Instructions // Education:  Do the following things EVERYDAY: Weigh yourself in the morning before breakfast. Write it down and keep it in a log. Take your medicines as prescribed Eat low salt foods--Limit salt (sodium) to 2000 mg per day.  Stay as active as you can everyday Limit all fluids for the day to less than 2 liters   Follow-Up:   Please follow up with our heart failure pharmacist in 3-4 weeks  Your physician recommends that you schedule a follow-up appointment in: 2 months   At the [facility], you and your health needs are our priority. We have a designated team specialized in the treatment of Heart Failure. This Care Team includes your primary Heart Failure Specialized Cardiologist (physician), Advanced Practice Providers (APPs- Physician Assistants and Nurse Practitioners), and Pharmacist who all work together to provide you with the care you need, when you need it.   You may see any of the following providers on your designated Care Team at your next follow up:  Dr. Glori Bickers Dr. Loralie Champagne Dr. Roxana Hires, NP Lyda Jester, [place] [facility] [place], [place] Forestine Na, NP Audry Riles, PharmD   Please be sure to bring in all your medications  bottles to every appointment.   Need to Contact us:  If you have any questions or concerns before your next appointment please send [place] a message through [place] or call our office at [phone].    TO LEAVE A MESSAGE FOR THE NURSE SELECT OPTION 2, PLEASE LEAVE A MESSAGE INCLUDING: YOUR NAME DATE OF BIRTH CALL BACK NUMBER REASON FOR CALL**this is important as we prioritize the call backs  YOU WILL RECEIVE A CALL BACK THE SAME DAY AS LONG AS YOU CALL BEFORE 4:00 PM

## 2022-08-23 ENCOUNTER — Other Ambulatory Visit (HOSPITAL_COMMUNITY): Payer: Self-pay

## 2022-08-23 ENCOUNTER — Telehealth (HOSPITAL_COMMUNITY): Payer: Self-pay

## 2022-08-23 DIAGNOSIS — I5022 Chronic systolic (congestive) heart failure: Secondary | ICD-10-CM

## 2022-08-23 NOTE — Telephone Encounter (Signed)
Advanced Heart Failure Patient Advocate Encounter  Prior authorization is required for Entresto 97-'103MG'$  tablets  Submitted: 08/23/2022 Key Puhi, CPhT Rx Patient Advocate Phone: [phone]

## 2022-08-23 NOTE — Telephone Encounter (Addendum)
Pt aware, agreeable, and verbalized understanding Patient stated she has been taking 20 meq daily. Told her to increase it to 40 meq daily.  Labs scheduled for next week   ----- Message from Hebert Soho, DO sent at 08/22/2022  4:49 PM EDT ----- Potassium is extremely low. Can we send her potassium 59mq once daily for the next 3 days with plan for repeat labs next week.  Thank you.

## 2022-08-23 NOTE — Telephone Encounter (Signed)
Advanced Heart Failure Patient Advocate Encounter    Prior authorization is required for Farxiga '10MG'$  tablets. PA submitted and APPROVED on 08/23/2022.  Key [id number]  Effective: 08/23/2022 - 08/24/2023  Clista Bernhardt, CPhT Rx Patient Advocate Phone: [phone]

## 2022-08-23 NOTE — Telephone Encounter (Signed)
Advanced Heart Failure Patient Advocate Encounter  Prior Authorization for Delene Loll has been approved.   Effective: 08/23/2022 to 08/24/2023 Test billing returns $4 copay  Clista Bernhardt, CPhT Rx Patient Advocate Phone: [phone]

## 2022-08-28 ENCOUNTER — Telehealth (HOSPITAL_COMMUNITY): Payer: Self-pay | Admitting: Cardiology

## 2022-08-28 ENCOUNTER — Ambulatory Visit (HOSPITAL_COMMUNITY)
Admission: RE | Admit: 2022-08-28 | Discharge: 2022-08-28 | Disposition: A | Discharge status: Home / Self Care | Payer: Medicaid Other | Source: Ambulatory Visit | Attending: Internal Medicine | Admitting: Internal Medicine

## 2022-08-28 DIAGNOSIS — I5022 Chronic systolic (congestive) heart failure: Secondary | ICD-10-CM | POA: Diagnosis present

## 2022-08-28 LAB — BASIC METABOLIC PANEL
Anion gap: 8 (ref 5–15)
BUN: 18 mg/dL (ref 6–20)
CO2: 25 mmol/L (ref 22–32)
Calcium: 8.7 mg/dL — ABNORMAL LOW (ref 8.9–10.3)
Chloride: 104 mmol/L (ref 98–111)
Creatinine, Ser: 1.22 mg/dL — ABNORMAL HIGH (ref 0.44–1.00)
GFR, Estimated: 59 mL/min — ABNORMAL LOW (ref >60)
Glucose, Bld: 101 mg/dL — ABNORMAL HIGH (ref 70–99)
Potassium: 3.2 mmol/L — ABNORMAL LOW (ref 3.5–5.1)
Sodium: 137 mmol/L (ref 135–145)

## 2022-08-28 NOTE — Telephone Encounter (Signed)
Pt has questions regarding diabetic meds, please advise

## 2022-08-30 NOTE — Telephone Encounter (Signed)
Spoke with patient on 10/16.

## 2022-09-01 ENCOUNTER — Other Ambulatory Visit: Payer: Self-pay | Admitting: Adult Medicine

## 2022-09-01 DIAGNOSIS — E039 Hypothyroidism, unspecified: Secondary | ICD-10-CM

## 2022-09-11 ENCOUNTER — Ambulatory Visit (HOSPITAL_COMMUNITY)
Admission: RE | Admit: 2022-09-11 | Discharge: 2022-09-11 | Disposition: A | Discharge status: Home / Self Care | Payer: Medicaid Other | Source: Ambulatory Visit | Attending: Cardiology | Admitting: Cardiology

## 2022-09-11 VITALS — BP 144/94 | HR 79 | Wt 364.4 lb

## 2022-09-11 DIAGNOSIS — J45909 Unspecified asthma, uncomplicated: Secondary | ICD-10-CM | POA: Insufficient documentation

## 2022-09-11 DIAGNOSIS — I5022 Chronic systolic (congestive) heart failure: Secondary | ICD-10-CM | POA: Diagnosis not present

## 2022-09-11 DIAGNOSIS — I110 Hypertensive heart disease with heart failure: Secondary | ICD-10-CM | POA: Insufficient documentation

## 2022-09-11 DIAGNOSIS — E669 Obesity, unspecified: Secondary | ICD-10-CM | POA: Diagnosis not present

## 2022-09-11 MED ORDER — SPIRONOLACTONE 25 MG PO TABS: 25.0000 mg | ORAL_TABLET | Freq: Every day | ORAL | 3 refills | Status: DC

## 2022-09-11 MED ORDER — CARVEDILOL 25 MG PO TABS: 25.0000 mg | ORAL_TABLET | Freq: Two times a day (BID) | ORAL | 3 refills | Status: AC

## 2022-09-11 MED ORDER — POTASSIUM CHLORIDE CRYS ER 20 MEQ PO TBCR: 40.0000 meq | EXTENDED_RELEASE_TABLET | Freq: Every day | ORAL | 3 refills | Status: AC

## 2022-09-11 NOTE — Progress Notes (Signed)
Advanced Heart Failure Clinic Note   Referring Physician: [place]  Primary Care: [facility], [place] Primary Cardiologist: Hebert Soho  HPI:  Wendy Humphrey is a 36 y.o. female with history of asthma, uncontrolled HTN, obesity and recently diagnosed systolic heart failure presenting today to establish care. According to Wendy Humphrey, she was in her usual state of health in late September 2023, when she began to feeling worsening SOB/chest pressure. She initially believed it to be secondary to her asthma, however, when symptoms did not improve she came to [facility] where she was diagnosed with HFrEF w/ LVEF 40-45%. She was started on low doses of GDMT and discharged home.    Established care with AHF clinic on 08/22/2022. Since discharge, Wendy Humphrey had done fairly well. She reported improvement in her SOB and has not had any further episodes of PND/orthopnea. Unfortunately her BP continued to remain severely elevated at 170/90 mmHg.   Today she returns to HF clinic for pharmacist medication titration. At last visit with MD, multiple medication changes were made. Losartan and clonidine were discontinued and Entresto 97/103 mg BID was started. Spironolactone 12.5 mg daily and Farxiga 10 mg daily were also initiated.  KCL supplements were increased to 40 mEq daily after labs returned with K of 2.8. Overall she is feeling well today. No dizziness, lightheadedness, CP or palpitations. Fatigue has improved. No significant SOB/DOE. Weight at home has been stable. She does not need a loop diuretic. No LEE, PND or orthopnea. BP at home has been ~120-140s/90-100s. She has been limiting salt in her diet. Taking all medications as prescribed and tolerating all medications.     HF Medications: Carvedilol 12.5 mg BID Entresto 97/103 mg BID Spironolactone 12.5 mg daily Farxiga 10 mg daily   Has the patient been experiencing any side effects to the medications prescribed?   no  Does the patient have any problems obtaining medications due to transportation or finances?  No; [employer] Medicaid  Understanding of regimen: good Understanding of indications: good Potential of compliance: good Patient understands to avoid NSAIDs. Patient understands to avoid decongestants.    Pertinent Lab Values: (08/28/2022) Serum creatinine 1.22, BUN 18, Potassium 3.2, Sodium 137, (08/22/2022) BNP 78.4  Vital Signs: Weight: 364.4 lbs (last clinic weight: 367.4 lbs) Blood pressure: 144/94  Heart rate: 79   Assessment/Plan: Stage C, NYHA IIB, Heart failure with reduced ejection fraction Etiology of JG:GEZMOQ nonischemic hypertensive cardiomyopathy; no symptoms of angina and at 36YO CAD is highly unlikely.  NYHA class / AHA Stage:IIB Volume status & Diuretics: Euvolemic on exam today  Vasodilators: Continue Entresto 97/103 mg BID Beta-Blocker: Increase carvedilol to 25 mg BID MRA: Increase spironolactone to 25 mg daily Cardiometabolic: Continue Farxiga 10 mg daily Devices therapies & Valvulopathies:Not indicated at this time; no valvular disease Advanced therapies:Not indicated; working on weight loss   2. Obesity  - Discussed bariatric surgery  - Referral information provided  - She would be a good candidate for semaglutide. However, Medicaid will not cover medication as she does not also have diabetes (A1C 5.1% 07/2022).   3. HTN - Continue Entresto, amlodipine and hydrochlorothiazide.  -BP elevated today, increase carvedilol and spironolactone as above. May be able to decrease/stop hydrochlorothiazide in the future if can control BP with other medications. Will not do today as BP remains above goal. Would consider increasing spironolactone to 50 mg daily for resistant hypertension and history of hypokalemia in the future.   Follow up with Dr.  Sabharwal on 10/23/2022   Audry Riles, PharmD, BCPS, BCCP, CPP Heart Failure Clinic Pharmacist [phone]

## 2022-09-11 NOTE — Patient Instructions (Signed)
It was a pleasure seeing you today!  MEDICATIONS: -We are changing your medications today -Increase spironolactone to 25 mg (1 tablet) daily. - Increase carvedilol to 25 mg (1 tablet) twice daily. -Call if you have questions about your medications.   NEXT APPOINTMENT: Return to clinic in 6 weeks with Dr. Daniel Nones.  In general, to take care of your heart failure: -Limit your fluid intake to 2 Liters (half-gallon) per day.   -Limit your salt intake to ideally 2-3 grams (2000-3000 mg) per day. -Weigh yourself daily and record, and bring that "weight diary" to your next appointment.  (Weight gain of 2-3 pounds in 1 day typically means fluid weight.) -The medications for your heart are to help your heart and help you live longer.   -Please contact us before stopping any of your heart medications.  Call the clinic at [phone] with questions or to reschedule future appointments.

## 2022-09-12 ENCOUNTER — Inpatient Hospital Stay (HOSPITAL_COMMUNITY): Admission: RE | Admit: 2022-09-12 | Payer: Medicaid Other | Source: Ambulatory Visit

## 2022-09-13 ENCOUNTER — Other Ambulatory Visit (HOSPITAL_COMMUNITY): Payer: Self-pay | Admitting: *Deleted

## 2022-09-13 MED ORDER — HYDROCHLOROTHIAZIDE 25 MG PO TABS: 25.0000 mg | ORAL_TABLET | Freq: Every day | ORAL | 1 refills | Status: DC

## 2022-09-15 ENCOUNTER — Ambulatory Visit
Admission: RE | Admit: 2022-09-15 | Discharge: 2022-09-15 | Disposition: A | Discharge status: Home / Self Care | Payer: Medicaid Other | Source: Ambulatory Visit | Attending: Adult Medicine | Admitting: Adult Medicine

## 2022-09-15 DIAGNOSIS — E039 Hypothyroidism, unspecified: Secondary | ICD-10-CM

## 2022-10-23 ENCOUNTER — Ambulatory Visit (HOSPITAL_COMMUNITY)
Admission: RE | Admit: 2022-10-23 | Discharge: 2022-10-23 | Disposition: A | Discharge status: Home / Self Care | Payer: Medicaid Other | Source: Ambulatory Visit | Attending: Cardiology | Admitting: Cardiology

## 2022-10-23 ENCOUNTER — Encounter (HOSPITAL_COMMUNITY): Payer: Self-pay | Admitting: Cardiology

## 2022-10-23 VITALS — BP 124/80 | HR 82 | Wt 374.4 lb

## 2022-10-23 DIAGNOSIS — I110 Hypertensive heart disease with heart failure: Secondary | ICD-10-CM | POA: Insufficient documentation

## 2022-10-23 DIAGNOSIS — Z79899 Other long term (current) drug therapy: Secondary | ICD-10-CM | POA: Diagnosis not present

## 2022-10-23 DIAGNOSIS — R634 Abnormal weight loss: Secondary | ICD-10-CM | POA: Diagnosis not present

## 2022-10-23 DIAGNOSIS — J45909 Unspecified asthma, uncomplicated: Secondary | ICD-10-CM | POA: Insufficient documentation

## 2022-10-23 DIAGNOSIS — G4730 Sleep apnea, unspecified: Secondary | ICD-10-CM | POA: Insufficient documentation

## 2022-10-23 DIAGNOSIS — E669 Obesity, unspecified: Secondary | ICD-10-CM | POA: Insufficient documentation

## 2022-10-23 DIAGNOSIS — Z6841 Body Mass Index (BMI) 40.0 and over, adult: Secondary | ICD-10-CM | POA: Diagnosis not present

## 2022-10-23 DIAGNOSIS — I5022 Chronic systolic (congestive) heart failure: Secondary | ICD-10-CM | POA: Diagnosis not present

## 2022-10-23 LAB — BASIC METABOLIC PANEL
Anion gap: 7 (ref 5–15)
BUN: 11 mg/dL (ref 6–20)
CO2: 26 mmol/L (ref 22–32)
Calcium: 8.9 mg/dL (ref 8.9–10.3)
Chloride: 106 mmol/L (ref 98–111)
Creatinine, Ser: 1.01 mg/dL — ABNORMAL HIGH (ref 0.44–1.00)
GFR, Estimated: >60 mL/min (ref >60)
Glucose, Bld: 94 mg/dL (ref 70–99)
Potassium: 3.5 mmol/L (ref 3.5–5.1)
Sodium: 139 mmol/L (ref 135–145)

## 2022-10-23 LAB — BRAIN NATRIURETIC PEPTIDE: B Natriuretic Peptide: 50.7 pg/mL (ref 0.0–100.0)

## 2022-10-23 NOTE — Progress Notes (Signed)
ADVANCED HEART FAILURE CLINIC NOTE  Referring Physician: [facility]  Primary Care: [facility]   HPI: Wendy Humphrey is a 36 y.o. female with history of asthma, uncontrolled HTN, obesity and recently diagnosed systolic heart failure presenting today to establish care. According to Wendy Humphrey, she was in her usual state of health in late September 2023, when she began to feeling worsening SOB/chest pressure. She initially believed it to be secondary to her asthma, however, when symptoms did not improve she came to [facility] where she was diagnosed with HFrEF w/ LVEF 45-50%. She was started on low doses of GDMT and discharged home.   Interval hx:  Doing fairly well since our last visit. No recent heart failure exacerbations. No reported SOB, CP, palpitations, LE edema, PND or orthopnea. Transitioned from clonidine to [facility] without difficulty. She remains motivated to exercise and lose weight.    Past Medical History:  Diagnosis Date   Anxiety    Asthma    Hypertension    Migraine    Morphea    Obesity    Thyroid disease     Current Outpatient Medications  Medication Sig Dispense Refill   acetaminophen (TYLENOL) 500 MG tablet Take 1,000 mg by mouth daily as needed for mild pain, moderate pain or headache.     amLODipine (NORVASC) 10 MG tablet Take 10 mg by mouth daily.     carvedilol (COREG) 25 MG tablet Take 1 tablet (25 mg total) by mouth 2 (two) times daily with a meal. 180 tablet 3   dapagliflozin propanediol (FARXIGA) 10 MG TABS tablet Take 1 tablet (10 mg total) by mouth daily before breakfast. 30 tablet 6   ferrous sulfate 325 (65 FE) MG tablet Take 1 tablet (325 mg total) by mouth daily with breakfast. 30 tablet 2   hydrochlorothiazide (HYDRODIURIL) 25 MG tablet Take 1 tablet (25 mg total) by mouth daily. 30 tablet 1   levothyroxine (SYNTHROID) 150 MCG tablet Take 150 mcg by mouth daily.     levothyroxine (SYNTHROID) 25 MCG tablet Take  25 mcg by mouth daily before breakfast.     OVER THE COUNTER MEDICATION Take 2 capsules by mouth daily. Sea Moss Capsule     OVER THE COUNTER MEDICATION Take 2 capsules by mouth daily. Beet Root     potassium chloride SA (KLOR-CON M) 20 MEQ tablet Take 2 tablets (40 mEq total) by mouth daily. 180 tablet 3   PROAIR HFA 108 (90 BASE) MCG/ACT inhaler Inhale 2 puffs into the lungs daily as needed for wheezing or shortness of breath.  0   sacubitril-valsartan (ENTRESTO) 97-103 MG Take 1 tablet by mouth 2 (two) times daily. 60 tablet 6   spironolactone (ALDACTONE) 25 MG tablet Take 1 tablet (25 mg total) by mouth daily. 90 tablet 3   Vitamin D, Ergocalciferol, (DRISDOL) 1.25 MG (50000 UNIT) CAPS capsule Take 50,000 Units by mouth once a week. On Mondays     No current facility-administered medications for this encounter.    No Known Allergies    Social History   Socioeconomic History   Marital status: Single    Spouse name: Not on file   Number of children: Not on file   Years of education: Not on file   Highest education level: Not on file  Occupational History   Not on file  Tobacco Use   Smoking status: Never   Smokeless tobacco: Never  Vaping Use   Vaping Use: Never used  Substance and Sexual Activity   Alcohol use: No   Drug use: No   Sexual activity: Yes    Birth control/protection: None  Other Topics Concern   Not on file  Social History Narrative   Not on file   Social Determinants of Health   Financial Resource Strain: Not on file  Food Insecurity: Not on file  Transportation Needs: Not on file  Physical Activity: Not on file  Stress: Not on file  Social Connections: Not on file  Intimate Partner Violence: Not on file     History reviewed. No pertinent family history.  PHYSICAL EXAM: Vitals:   10/23/22 0952  BP: 124/80  Pulse: 82  SpO2: 98%    GENERAL: comfortable, NAD.  HEENT: Negative for arcus senilis or xanthelasma. There is no scleral icterus.   The mucous membranes are pink and moist.   NECK: Supple, No masses. Normal carotid upstrokes without bruits. No masses or thyromegaly.    CHEST: There are no chest wall deformities. There is no chest wall tenderness. Respirations are unlabored.  Lungs- CTA B/L; no crackles CARDIAC:  JVP:  <8         Normal S1, S2  RRR. No murmurs, rubs or gallops.  Pulses are 2+ and symmetrical in upper and lower extremities. 1+ edema.  ABDOMEN: Soft, non-tender, non-distended. There are no masses or hepatomegaly. There are normal bowel sounds.  EXTREMITIES: warm, well perfused; 1+ edema.  LYMPHATIC: No axillary or supraclavicular lymphadenopathy.  NEUROLOGIC: Patient is oriented x3 with no focal or lateralizing neurologic deficits.  PSYCH: Patients affect is appropriate, there is no evidence of anxiety or depression.  SKIN: Warm and dry; no lesions or wounds.   DATA REVIEW  ECG:NSR  ECHO:  08/12/22: LVEF 50%, LVH.   CATH:No prior cath   ASSESSMENT & PLAN:  Stage C, NYHA IIB, Heart failure with reduced ejection fraction Etiology of BO:FBPZWC nonischemic hypertensive cardiomyopathy; no symptoms of angina and at 36YO CAD is highly unlikely.  NYHA class / AHA Stage:IIB Volume status & Diuretics: Euvolemic to mildly hypervolemic Vasodilators:Now off clonidine. Continue Entresto 97/103 BID. BP well controlled today. Beta-Blocker:Coreg uptitrated to goal dose at '25mg'$  BID w/o difficulty.  MRA: increase spironolactone to '25mg'$  daily.  Cardiometabolic: continue farxiga '10mg'$   Devices therapies & Valvulopathies:Not indicated at this time; no valvular disease Advanced therapies:Not indicated; working aggressively on weight loss.   2. Obesity  - Discussed bariatric surgery  - Referring to weight loss clinic.  3. HTN - Entresto 97/103 BID starting today; D/C clonidine & losartan - On follow up will plan to increase coreg and D/C amlodipine and/or HCTZ.   4. Sleep apnea - Tested negative previously. Will  hold off further evaluation.    Follow-up:  PRN  Alannah Averhart Advanced Heart Failure Mechanical Circulatory Support

## 2022-10-23 NOTE — Patient Instructions (Signed)
There has been no changes to your medications.  Labs done today, your results will be available in MyChart, we will contact you for abnormal readings.  Your physician has requested that you have an echocardiogram. Echocardiography is a painless test that uses sound waves to create images of your heart. It provides your doctor with information about the size and shape of your heart and how well your heart's chambers and valves are working. This procedure takes approximately one hour. There are no restrictions for this procedure. Please do NOT wear cologne, perfume, aftershave, or lotions (deodorant is allowed). Please arrive 15 minutes prior to your appointment time.  Your physician recommends that you schedule a follow-up appointment in: as needed.  If you have any questions or concerns before your next appointment please send [place] a message through [place] or call our office at [phone].    TO LEAVE A MESSAGE FOR THE NURSE SELECT OPTION 2, PLEASE LEAVE A MESSAGE INCLUDING: YOUR NAME DATE OF BIRTH CALL BACK NUMBER REASON FOR CALL**this is important as we prioritize the call backs  YOU WILL RECEIVE A CALL BACK THE SAME DAY AS LONG AS YOU CALL BEFORE 4:00 PM  At the [facility], you and your health needs are our priority. As part of our continuing mission to provide you with exceptional heart care, we have created designated Provider Care Teams. These Care Teams include your primary Cardiologist (physician) and Advanced Practice Providers (APPs- Physician Assistants and Nurse Practitioners) who all work together to provide you with the care you need, when you need it.   You may see any of the following providers on your designated Care Team at your next follow up: Dr Glori Bickers Dr Loralie Champagne Dr. Roxana Hires, NP Lyda Jester, [place] [facility] [place], [place] Forestine Na, NP Audry Riles, PharmD   Please be sure to bring in all your  medications bottles to every appointment.

## 2022-10-25 ENCOUNTER — Encounter: Payer: Medicaid Other | Admitting: Student

## 2022-10-25 NOTE — Progress Notes (Deleted)
   ANNUAL EXAM Patient name: Wendy Humphrey MRN [id number]  Date of birth: [birth year] Chief Complaint:   No chief complaint on file.  History of Present Illness:   Wendy Humphrey is a 36 y.o. No obstetric history on file. African-American female being seen today for a routine annual exam.  Current complaints: ***  No LMP recorded.   The pregnancy intention screening data noted above was reviewed. Potential methods of contraception were discussed. The patient elected to proceed with No data recorded.   Last pap ***. Results were: {Pap findings:25134}. H/O abnormal pap: {yes/yes***/no:23866} Last mammogram: ***. Results were: {normal, abnormal, n/a:23837}. Family h/o breast cancer: {yes***/no:23838} Last colonoscopy: ***. Results were: {normal, abnormal, n/a:23837}. Family h/o colorectal cancer: {yes***/no:23838}      No data to display               No data to display           Review of Systems:   Pertinent items are noted in HPI Denies any headaches, blurred vision, fatigue, shortness of breath, chest pain, abdominal pain, abnormal vaginal discharge/itching/odor/irritation, problems with periods, bowel movements, urination, or intercourse unless otherwise stated above. Pertinent History Reviewed:  Reviewed past medical,surgical, social and family history.  Reviewed problem list, medications and allergies. Physical Assessment:  There were no vitals filed for this visit.There is no height or weight on file to calculate BMI.        Physical Examination:   General appearance - well appearing, and in no distress  Mental status - alert, oriented to person, place, and time  Psych:  She has a normal mood and affect  Skin - warm and dry, normal color, no suspicious lesions noted  Chest - effort normal, all lung fields clear to auscultation bilaterally  Heart - normal rate and regular rhythm  Neck:  midline trachea, no thyromegaly or nodules  Breasts - breasts appear normal,  no suspicious masses, no skin or nipple changes or  axillary nodes  Abdomen - soft, nontender, nondistended, no masses or organomegaly  Pelvic - VULVA: normal appearing vulva with no masses, tenderness or lesions  VAGINA: normal appearing vagina with normal color and discharge, no lesions  CERVIX: normal appearing cervix without discharge or lesions, no CMT  Thin prep pap is {Desc; done/not:10129} *** HR HPV cotesting  UTERUS: uterus is felt to be normal size, shape, consistency and nontender   ADNEXA: No adnexal masses or tenderness noted.  Rectal - normal rectal, good sphincter tone, no masses felt. Hemoccult: ***  Extremities:  No swelling or varicosities noted  Chaperone present for exam  No results found for this or any previous visit (from the past 24 hour(s)).  Assessment & Plan:  1. Women's annual routine gynecological examination ***  2. Encounter to establish care ***  3. BMI 60.0-69.9, adult ([place]) - referred to weight loss clinic by cardiologist  4. Hypothyroidism, unspecified type - taking synthroid, TSH collected today  5. Anemia, unspecified type - Recent workup in ED  6. Malignant hypertension - taking Entresto, Hctz., and Amlodipine  Labs/procedures today: ***  Mammogram: @ 35yo, or sooner if problems Colonoscopy: @ 35yo, or sooner if problems  No orders of the defined types were placed in this encounter.   Meds: No orders of the defined types were placed in this encounter.   Follow-up: No follow-ups on file.  Johnston Ebbs, NP 10/25/2022 7:44 AM

## 2022-10-26 NOTE — Progress Notes (Signed)
Erroneous encounter-disregard

## 2022-11-28 ENCOUNTER — Ambulatory Visit (HOSPITAL_COMMUNITY)
Admission: RE | Admit: 2022-11-28 | Discharge: 2022-11-28 | Disposition: A | Discharge status: Home / Self Care | Payer: BLUE CROSS/BLUE SHIELD | Source: Ambulatory Visit | Attending: Cardiology | Admitting: Cardiology

## 2022-11-28 ENCOUNTER — Encounter (INDEPENDENT_AMBULATORY_CARE_PROVIDER_SITE_OTHER): Payer: Self-pay

## 2022-11-28 DIAGNOSIS — I110 Hypertensive heart disease with heart failure: Secondary | ICD-10-CM | POA: Insufficient documentation

## 2022-11-28 DIAGNOSIS — I5022 Chronic systolic (congestive) heart failure: Secondary | ICD-10-CM | POA: Diagnosis present

## 2022-11-28 LAB — ECHOCARDIOGRAM COMPLETE
AR max vel: 2.45 cm2
AV Area VTI: 2.55 cm2
AV Area mean vel: 2.34 cm2
AV Mean grad: 3 mmHg
AV Peak grad: 5.9 mmHg
Ao pk vel: 1.21 m/s
Area-P 1/2: 3.77 cm2
S' Lateral: 3.9 cm

## 2022-11-28 NOTE — Progress Notes (Signed)
Echocardiogram 2D Echocardiogram has been performed.  Wendy Humphrey 11/28/2022, 11:15 AM

## 2022-12-18 ENCOUNTER — Encounter: Payer: Self-pay | Admitting: Obstetrics and Gynecology

## 2022-12-18 ENCOUNTER — Other Ambulatory Visit (HOSPITAL_COMMUNITY)
Admission: RE | Admit: 2022-12-18 | Discharge: 2022-12-18 | Disposition: A | Discharge status: Home / Self Care | Payer: BLUE CROSS/BLUE SHIELD | Source: Ambulatory Visit | Attending: Student | Admitting: Student

## 2022-12-18 ENCOUNTER — Ambulatory Visit (INDEPENDENT_AMBULATORY_CARE_PROVIDER_SITE_OTHER): Payer: BLUE CROSS/BLUE SHIELD | Admitting: Obstetrics and Gynecology

## 2022-12-18 VITALS — BP 161/109 | HR 80 | Ht 64.0 in | Wt 374.8 lb

## 2022-12-18 DIAGNOSIS — Z01419 Encounter for gynecological examination (general) (routine) without abnormal findings: Secondary | ICD-10-CM | POA: Diagnosis present

## 2022-12-18 DIAGNOSIS — A749 Chlamydial infection, unspecified: Secondary | ICD-10-CM

## 2022-12-18 DIAGNOSIS — R350 Frequency of micturition: Secondary | ICD-10-CM | POA: Diagnosis not present

## 2022-12-18 DIAGNOSIS — R8290 Unspecified abnormal findings in urine: Secondary | ICD-10-CM

## 2022-12-18 DIAGNOSIS — I5022 Chronic systolic (congestive) heart failure: Secondary | ICD-10-CM

## 2022-12-18 DIAGNOSIS — Z0000 Encounter for general adult medical examination without abnormal findings: Secondary | ICD-10-CM

## 2022-12-18 DIAGNOSIS — Z113 Encounter for screening for infections with a predominantly sexual mode of transmission: Secondary | ICD-10-CM

## 2022-12-18 DIAGNOSIS — I10 Essential (primary) hypertension: Secondary | ICD-10-CM

## 2022-12-18 LAB — POCT URINALYSIS DIPSTICK
Bilirubin, UA: NEGATIVE
Blood, UA: POSITIVE
Glucose, UA: POSITIVE — AB
Ketones, UA: NEGATIVE
Nitrite, UA: POSITIVE
Protein, UA: POSITIVE — AB
Spec Grav, UA: 1.015 (ref 1.010–1.025)
Urobilinogen, UA: 0.2 E.U./dL
pH, UA: 6.5 (ref 5.0–8.0)

## 2022-12-18 MED ORDER — SULFAMETHOXAZOLE-TRIMETHOPRIM 800-160 MG PO TABS: 1.0000 | ORAL_TABLET | Freq: Two times a day (BID) | ORAL | 0 refills | Status: DC

## 2022-12-18 NOTE — Progress Notes (Signed)
WELL-WOMAN PHYSICAL Patient name: Wendy Humphrey MRN [id number]  Date of birth: Oct 10, 1986 Chief Complaint:   No chief complaint on file.  History of Present Illness:   Wendy Humphrey is a 37 y.o. [id number] African American female being seen today for a routine well-woman exam.  Current complaints: requesting STI testing and cloudy urine with a foul odor  PCP: [facility]      does desire labs Patient's last menstrual period was 12/03/2022. The current method of family planning is condoms.  Last pap 2023. Results were: normal per patient Last mammogram: n/a. Family h/o breast cancer: unsure Last colonoscopy: n/a. Family h/o colorectal cancer: No Review of Systems:   Pertinent items are noted in HPI Denies any headaches, blurred vision, fatigue, shortness of breath, chest pain, abdominal pain, abnormal vaginal discharge/itching/odor/irritation, problems with periods, bowel movements, urination, or intercourse unless otherwise stated above. Pertinent History Reviewed:  Reviewed past medical,surgical, social and family history.  Reviewed problem list, medications and allergies. Physical Assessment:   Vitals:   12/18/22 0914 12/18/22 0922  BP: (!) 184/111 (!) 161/109  Pulse: 78 80  Weight: (!) 374 lb 12.8 oz (170 kg)   Height: '5\' 4"'$  (1.626 m)   Body mass index is 64.33 kg/m.        Physical Examination:   General appearance - well appearing, and in no distress  Mental status - alert, oriented to person, place, and time  Psych:  She has a normal mood and affect  Skin - warm and dry, normal color, no suspicious lesions noted  Chest - effort normal, all lung fields clear to auscultation bilaterally  Heart - normal rate and regular rhythm  Neck:  midline trachea, no thyromegaly or nodules Breasts - breasts appear normal, no suspicious masses, bilateral skin abnormalities around areola that are "from childhood skin disorder", no nipple changes or axillary nodes  Abdomen -  soft, nontender, nondistended, no masses or organomegaly  Pelvic - STI swabs collected by blind-swab technique by patient  Rectal - deferred  Extremities:  No swelling or varicosities noted  Results for orders placed or performed in visit on 12/18/22 (from the past 24 hour(s))  POCT urinalysis dipstick   Collection Time: 12/18/22  9:43 AM  Result Value Ref Range   Color, UA Yellow    Clarity, UA Hazy    Glucose, UA Positive (A) Negative   Bilirubin, UA Negative    Ketones, UA Negative    Spec Grav, UA 1.015 1.010 - 1.025   Blood, UA Positive    pH, UA 6.5 5.0 - 8.0   Protein, UA Positive (A) Negative   Urobilinogen, UA 0.2 0.2 or 1.0 E.U./dL   Nitrite, UA Positive    Leukocytes, UA Moderate (2+) (A) Negative   Appearance     Odor      Assessment & Plan:  1) Encounter for well woman exam without gynecological exam  2) Routine screening for STI (sexually transmitted infection) - Cervicovaginal ancillary only  3) Cloudy urine - POCT urinalysis dipstick - Urine Culture - Rx: sulfamethoxazole-trimethoprim (BACTRIM DS) 800-160 MG tablet; Take 1 tablet by mouth 2 (two) times daily.  Dispense: 6 tablet; Refill: 0  4) Chronic hypertension - F/U with PCP - Advised to take HTN meds as prescribed  5) Chronic systolic heart failure ([place]) - Per patient her EF was 40% and now is up to 55%  Labs/procedures today: STI testing  Mammogram at age 45 or sooner if problems Colonoscopy at  age 25 or sooner if problems  Orders Placed This Encounter  Procedures   Urine Culture   POCT urinalysis dipstick    Meds: No orders of the defined types were placed in this encounter.   Follow-up: Return in about 1 year (around 12/19/2023) for Annual Exam.  Laury Deep MSN, CNM 12/18/2022 9:57 AM

## 2022-12-18 NOTE — Progress Notes (Signed)
New pt presents for annual exam. Last PAP 2023 and was normal per pt. Pt reports having cloudy urine with foul odor and urgency. Denies any dysuria. Pt would like STD testing. Denies any vaginal discharge or discomfort. Denies any abnormal breast changes. No other concerns at this time.   PHQ - 1 GAD - 0

## 2022-12-19 ENCOUNTER — Encounter: Payer: Self-pay | Admitting: Obstetrics and Gynecology

## 2022-12-19 LAB — CERVICOVAGINAL ANCILLARY ONLY
Chlamydia: POSITIVE — AB
Comment: NEGATIVE
Comment: NEGATIVE
Comment: NORMAL
Neisseria Gonorrhea: NEGATIVE
Trichomonas: NEGATIVE

## 2022-12-19 MED ORDER — DOXYCYCLINE HYCLATE 100 MG PO CAPS: 100.0000 mg | ORAL_CAPSULE | Freq: Two times a day (BID) | ORAL | 0 refills | Status: DC

## 2022-12-19 NOTE — Addendum Note (Signed)
Addended by: Graceann Congress on: 12/19/2022 03:40 PM   Modules accepted: Orders

## 2022-12-20 ENCOUNTER — Encounter (INDEPENDENT_AMBULATORY_CARE_PROVIDER_SITE_OTHER): Payer: Self-pay | Admitting: Internal Medicine

## 2022-12-20 ENCOUNTER — Ambulatory Visit (INDEPENDENT_AMBULATORY_CARE_PROVIDER_SITE_OTHER): Payer: BLUE CROSS/BLUE SHIELD | Admitting: Internal Medicine

## 2022-12-20 VITALS — BP 168/104 | HR 96 | Temp 97.9°F | Ht 63.0 in

## 2022-12-20 DIAGNOSIS — I5022 Chronic systolic (congestive) heart failure: Secondary | ICD-10-CM | POA: Diagnosis not present

## 2022-12-20 DIAGNOSIS — Z6841 Body Mass Index (BMI) 40.0 and over, adult: Secondary | ICD-10-CM | POA: Diagnosis not present

## 2022-12-20 NOTE — Progress Notes (Signed)
Office: [phone]  /  Fax: [phone]   Initial Visit  Wendy Humphrey was seen in clinic today to evaluate for obesity. She is interested in losing weight to improve overall health and reduce the risk of weight related complications. She presents today to review program treatment options, initial physical assessment, and evaluation.     She was referred by: PCP  When asked what else they would like to accomplish? She states: Improve existing medical conditions and Reduce number of medications  When asked how has your weight affected you? 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  Contributing factors: Family history, Reduced physical activity, and Pregnancy  Weight promoting medications identified: None  Current nutrition plan: Portion control / smart choices  Current level of physical activity: Other: Doing combination of cardio and strengthening started recently.  Current or previous pharmacotherapy: Phentermine  Response to medication: Lost weight initially but was unable to sustain weight loss   Past medical history includes:   Past Medical History:  Diagnosis Date   Anxiety    Asthma    Hypertension    Migraine    Morphea    Obesity    Thyroid disease      Objective:   BP (!) 168/104   Pulse 96   Temp 97.9 F (36.6 C)   Ht '5\' 3"'$  (1.6 m)   LMP 12/03/2022   SpO2 93%   BMI 66.39 kg/m  She was weighed on the bioimpedance scale: Body mass index is 66.39 kg/m.  Peak Weight: 375, Body Fat%: 61, Visceral Fat Rating: 28, Weight trend over the last 12 months: Unchanged  General:  Alert, oriented and cooperative. Patient is in no acute distress.  Respiratory: Normal respiratory effort, no problems with respiration noted  Extremities: Normal range of motion.     Mental Status: Normal mood and affect. Normal behavior. Normal judgment and thought content.   DIAGNOSTIC DATA REVIEWED:  BMET    Component Value Date/Time   NA 139 10/23/2022 1023   K 3.5 10/23/2022 1023   CL 106 10/23/2022 1023   CO2 26 10/23/2022 1023   GLUCOSE 94 10/23/2022 1023   BUN 11 10/23/2022 1023   CREATININE 1.01 (H) 10/23/2022 1023   CALCIUM 8.9 10/23/2022 1023   GFRNONAA >60 10/23/2022 1023   GFRAA >60 03/05/2019 2004   Lab Results  Component Value Date   HGBA1C 5.1 08/12/2022   No results found for: "INSULIN" CBC    Component Value Date/Time   WBC 12.7 (H) 08/12/2022 0218   RBC 4.25 08/12/2022 0218   RBC 4.37 08/12/2022 0218   HGB 8.9 (L) 08/12/2022 0218   HCT 30.1 (L) 08/12/2022 0218   PLT 263 08/12/2022 0218   MCV 68.9 (L) 08/12/2022 0218   MCH 20.4 (L) 08/12/2022 0218   MCHC 29.6 (L) 08/12/2022 0218   RDW 19.5 (H) 08/12/2022 0218   Iron/TIBC/Ferritin/ %Sat    Component Value Date/Time   IRON 18 (L) 08/12/2022 0218   TIBC 336 08/12/2022 0218   FERRITIN 14 08/12/2022 0218   IRONPCTSAT 5 (L) 08/12/2022 0218   Lipid Panel  No results found for: "CHOL", "TRIG", "HDL", "CHOLHDL", "VLDL", "LDLCALC", "LDLDIRECT" Hepatic Function Panel     Component Value Date/Time   PROT 7.6 08/22/2022 1009   ALBUMIN 3.4 (L) 08/22/2022 1009   AST 12 (L) 08/22/2022 1009  ALT 11 08/22/2022 1009   ALKPHOS 71 08/22/2022 1009   BILITOT 0.7 08/22/2022 1009      Component Value Date/Time   TSH 4.284 08/12/2022 0218     Assessment and Plan:  Chronic systolic heart failure (HCC) Assessment & Plan: Reviewed most recent echo which shows an improvement in ejection fraction now at 55%.  She has severe concentric LVH.  Her blood pressure is poorly controlled today she acknowledges running out of medications.  We emphasized the importance of taking medications every day and monitoring blood pressure for goal of less than 120/80.   Class 3 severe obesity with serious  comorbidity and body mass index (BMI) of 60.0 to 69.9 in adult, unspecified obesity type [facility]) Assessment & Plan: We reviewed weight, biometrics, associated medical conditions and contributing factors with patient. She would benefit from weight loss therapy via a modified calorie, low-carb, high-protein nutritional plan tailored to their REE (resting energy expenditure) which will be determined by indirect calorimetry.  We will also assess for cardiometabolic risk and nutritional derangements via fasting serologies at her next appointment.          Obesity Treatment / Action Plan:  Patient will work on garnering support from family and friends to begin weight loss journey. Will work on eliminating or reducing the presence of highly palatable, calorie dense foods in the home. Will complete provided nutritional and psychosocial assessment questionnaire before the next appointment. Will be scheduled for indirect calorimetry to determine resting energy expenditure in a fasting state.  This will allow [place] to create a reduced calorie, high-protein meal plan to promote loss of fat mass while preserving muscle mass. Counseled on the health benefits of losing 5%-15% of total body weight. Was counseled on nutritional approaches to weight loss and benefits of complex carbs and high quality protein as part of nutritional weight management. Was counseled on pharmacotherapy and role as an adjunct in weight management.   Obesity Education Performed Today:  She was weighed on the bioimpedance scale and results were discussed and documented in the synopsis.  We discussed obesity as a disease and the importance of a more detailed evaluation of all the factors contributing to the disease.  We discussed the importance of long term lifestyle changes which include nutrition, exercise and behavioral modifications as well as the importance of customizing this to her specific health and social needs.  We discussed  the benefits of reaching a healthier weight to alleviate the symptoms of existing conditions and reduce the risks of the biomechanical, metabolic and psychological effects of obesity.  Wendy Humphrey appears to be in the action stage of change and states they are ready to start intensive lifestyle modifications and behavioral modifications.  30 minutes was spent today on this visit including the above counseling, pre-visit chart review, and post-visit documentation.  Reviewed by clinician on day of visit: allergies, medications, problem list, medical history, surgical history, family history, social history, and previous encounter notes.    I have reviewed the above documentation for accuracy and completeness, and I agree with the above.  Thomes Dinning, MD

## 2022-12-20 NOTE — Assessment & Plan Note (Signed)
We reviewed weight, biometrics, associated medical conditions and contributing factors with patient. She would benefit from weight loss therapy via a modified calorie, low-carb, high-protein nutritional plan tailored to their REE (resting energy expenditure) which will be determined by indirect calorimetry.  We will also assess for cardiometabolic risk and nutritional derangements via fasting serologies at her next appointment. 

## 2022-12-20 NOTE — Assessment & Plan Note (Addendum)
Reviewed most recent echo which shows an improvement in ejection fraction now at 55%.  She has severe concentric LVH.  Her blood pressure is poorly controlled today she acknowledges running out of medications.  She is on amlodipine, carvedilol, Farxiga, hydrochlorothiazide, Entresto, spironolactone.  We emphasized the importance of taking medications every day and monitoring blood pressure for goal of less than 120/80.

## 2023-02-12 ENCOUNTER — Ambulatory Visit: Payer: BLUE CROSS/BLUE SHIELD

## 2023-02-18 ENCOUNTER — Other Ambulatory Visit (HOSPITAL_COMMUNITY): Payer: Self-pay | Admitting: Cardiology

## 2023-06-01 ENCOUNTER — Encounter: Payer: Self-pay | Admitting: Obstetrics and Gynecology

## 2023-06-01 ENCOUNTER — Other Ambulatory Visit (HOSPITAL_COMMUNITY): Admission: RE | Admit: 2023-06-01 | Payer: BLUE CROSS/BLUE SHIELD | Source: Ambulatory Visit

## 2023-06-01 ENCOUNTER — Ambulatory Visit (INDEPENDENT_AMBULATORY_CARE_PROVIDER_SITE_OTHER): Payer: BLUE CROSS/BLUE SHIELD | Admitting: Obstetrics and Gynecology

## 2023-06-01 VITALS — BP 174/115 | HR 90 | Wt 387.4 lb

## 2023-06-01 DIAGNOSIS — Z01419 Encounter for gynecological examination (general) (routine) without abnormal findings: Secondary | ICD-10-CM | POA: Insufficient documentation

## 2023-06-01 DIAGNOSIS — N898 Other specified noninflammatory disorders of vagina: Secondary | ICD-10-CM

## 2023-06-01 NOTE — Progress Notes (Signed)
   WELL-WOMAN PHYSICAL & PAP Patient name: Wendy Humphrey MRN [id number]  Date of birth: 01/31/1986 Chief Complaint:   Gynecologic Exam  History of Present Illness:   Wendy Humphrey is a 37 y.o. G1P0 African American female being seen today for a routine well-woman exam.  Current complaints: vaginal irritation after using a new soap by [place] at her sister's house 2 weeks ago. She denies being sexually active. She requests for a pap to be done.  PCP: [facility]      does not desire labs No LMP recorded. The current method of family planning is abstinence.  Last pap 2+ years ago. Results were: abnormal  per patient Last mammogram: n/a. Family h/o breast cancer: No Last colonoscopy: n/a. Family h/o colorectal cancer: No Review of Systems:   Pertinent items are noted in HPI Denies any headaches, blurred vision, fatigue, shortness of breath, chest pain, abdominal pain, abnormal vaginal discharge/itching/odor/irritation, problems with periods, bowel movements, urination, or intercourse unless otherwise stated above. Pertinent History Reviewed:  Reviewed past medical,surgical, social and family history.  Reviewed problem list, medications and allergies. Physical Assessment:   Vitals:   06/01/23 1019  BP: (!) 174/115  Pulse: 90  Weight: (!) 387 lb 6.4 oz (175.7 kg)  Body mass index is 68.62 kg/m.        Physical Examination:   General appearance - well appearing, and in no distress  Mental status - alert, oriented to person, place, and time  Psych:  She has a normal mood and affect  Skin - warm and dry, normal color, no suspicious lesions noted  Chest - effort normal, all lung fields clear to auscultation bilaterally  Heart - normal rate and regular rhythm  Neck:  midline trachea, no thyromegaly or nodules  Breasts - breasts appear normal, no suspicious masses, no skin or nipple changes or  axillary nodes  Abdomen - soft, nontender, nondistended, no masses or  organomegaly  Pelvic - VULVA: normal appearing vulva with no masses, tenderness or lesions  VAGINA: normal appearing vagina with normal color and discharge, no lesions  CERVIX: normal appearing cervix without discharge or lesions, no CMT  Thin prep pap is done with reflex HR HPV cotesting  UTERUS: uterus is felt to be normal size, shape, consistency and nontender   ADNEXA: No adnexal masses or tenderness noted.  Rectal - deferred  Extremities:  No swelling or varicosities noted  No results found for this or any previous visit (from the past 24 hour(s)).  Assessment & Plan:  1) Well woman exam with routine gynecological exam - Cytology - PAP  2) Vaginal irritation - Cervicovaginal ancillary only   Labs/procedures today: wet prep, GC/CT and pap  Mammogram at age 51 or sooner if problems Colonoscopy at age 48 or sooner if problems  No orders of the defined types were placed in this encounter.   Meds: No orders of the defined types were placed in this encounter.   Follow-up: Return in about 1 year (around 05/31/2024) for Annual Exam.  Raelyn Mora MSN, CNM 06/01/2023 12:04 PM

## 2023-06-01 NOTE — Progress Notes (Unsigned)
CC: Needing Pap  Pt stating that pap was 2+ years ago and abnormal at [facility] per patient   Wants to see if possible yeast or BV, denies any vaginal discharge but does notice vaginal irritation

## 2023-06-04 LAB — CERVICOVAGINAL ANCILLARY ONLY
Bacterial Vaginitis (gardnerella): NEGATIVE
Candida Glabrata: NEGATIVE
Candida Vaginitis: NEGATIVE
Comment: NEGATIVE
Comment: NEGATIVE
Comment: NEGATIVE

## 2023-06-06 ENCOUNTER — Other Ambulatory Visit: Payer: Self-pay

## 2023-06-06 ENCOUNTER — Telehealth: Payer: Self-pay

## 2023-06-06 DIAGNOSIS — A599 Trichomoniasis, unspecified: Secondary | ICD-10-CM

## 2023-06-06 LAB — CYTOLOGY - PAP
Chlamydia: NEGATIVE
Comment: NEGATIVE
Comment: NEGATIVE
Comment: NEGATIVE
Comment: NORMAL
Diagnosis: NEGATIVE
High risk HPV: NEGATIVE
Neisseria Gonorrhea: NEGATIVE
Trichomonas: POSITIVE — AB

## 2023-06-06 MED ORDER — METRONIDAZOLE 500 MG PO TABS: 500.0000 mg | ORAL_TABLET | Freq: Two times a day (BID) | ORAL | 0 refills | Status: AC

## 2023-06-06 NOTE — Telephone Encounter (Signed)
Pt called requesting medication for recent lab results. Rx sent. Pt advised to inform partner(s) so partner(s) can be tested and treated. Pt advised to abstain from intercourse until medication course has been completed. Pt advised she will need a test of cure in 3 months.

## 2023-06-06 NOTE — Progress Notes (Signed)
Rx sent per protocol 

## 2023-06-07 ENCOUNTER — Encounter: Payer: Self-pay | Admitting: Obstetrics and Gynecology

## 2024-02-07 ENCOUNTER — Telehealth: Payer: Self-pay | Admitting: Cardiology

## 2024-02-07 NOTE — Telephone Encounter (Incomplete)
 Called to confirm/remind patient of their appointment at the [facility] on 02/08/24***.   Appointment:   [x] Confirmed  [] Left mess   [] No answer/No voice mail  [] Phone not in service  Patient reminded to bring all medications and/or complete list.  Confirmed patient has transportation. Gave directions, instructed to utilize valet parking.

## 2024-02-07 NOTE — Progress Notes (Unsigned)
 [facility] NOTE  Referring Physician: [facility]  Primary Care: [facility]  CC: HFrEF  HPI: Wendy Humphrey is a 38 y.o. female with history of asthma, uncontrolled HTN, obesity and recently diagnosed systolic heart failure presenting today to establish care. According to Wendy Humphrey, she was in her usual state of health in late September 2023, when she began to feeling worsening SOB/chest pressure. She initially believed it to be secondary to her asthma, however, when symptoms did not improve she came to [facility] where she was diagnosed with HFrEF w/ LVEF 45-50%. She was started on low doses of GDMT and discharged home.   Interval hx:  Doing fairly well since our last visit. No recent heart failure exacerbations. No reported SOB, CP, palpitations, LE edema, PND or orthopnea. Transitioned from clonidine to [facility] without difficulty. She remains motivated to exercise and lose weight.    Current Outpatient Medications  Medication Sig Dispense Refill   acetaminophen (TYLENOL) 500 MG tablet Take 1,000 mg by mouth daily as needed for mild pain, moderate pain or headache.     amLODipine (NORVASC) 10 MG tablet Take 10 mg by mouth daily.     carvedilol (COREG) 25 MG tablet Take 1 tablet (25 mg total) by mouth 2 (two) times daily with a meal. 180 tablet 3   dapagliflozin propanediol (FARXIGA) 10 MG TABS tablet Take 1 tablet (10 mg total) by mouth daily before breakfast. 30 tablet 6   doxycycline (VIBRAMYCIN) 100 MG capsule Take 1 capsule (100 mg total) by mouth 2 (two) times daily. 14 capsule 0   ferrous sulfate 325 (65 FE) MG tablet Take 1 tablet (325 mg total) by mouth daily with breakfast. 30 tablet 2   hydrochlorothiazide (HYDRODIURIL) 25 MG tablet TAKE 1 TABLET(25 MG) BY MOUTH DAILY 90 tablet 3   levothyroxine (SYNTHROID) 150 MCG tablet Take 150 mcg by mouth daily.     levothyroxine (SYNTHROID) 25 MCG tablet Take 25 mcg by mouth daily  before breakfast.     OVER THE COUNTER MEDICATION Take 2 capsules by mouth daily. Sea Moss Capsule     OVER THE COUNTER MEDICATION Take 2 capsules by mouth daily. Beet Root     potassium chloride SA (KLOR-CON M) 20 MEQ tablet Take 2 tablets (40 mEq total) by mouth daily. 180 tablet 3   PROAIR HFA 108 (90 BASE) MCG/ACT inhaler Inhale 2 puffs into the lungs daily as needed for wheezing or shortness of breath.  0   sacubitril-valsartan (ENTRESTO) 97-103 MG Take 1 tablet by mouth 2 (two) times daily. 60 tablet 6   spironolactone (ALDACTONE) 25 MG tablet Take 1 tablet (25 mg total) by mouth daily. 90 tablet 3   sulfamethoxazole-trimethoprim (BACTRIM DS) 800-160 MG tablet Take 1 tablet by mouth 2 (two) times daily. 6 tablet 0   Vitamin D, Ergocalciferol, (DRISDOL) 1.25 MG (50000 UNIT) CAPS capsule Take 50,000 Units by mouth once a week. On Mondays     No current facility-administered medications for this visit.      PHYSICAL EXAM: There were no vitals filed for this visit. GENERAL: NAD Lungs- *** CARDIAC:  JVP: *** cm          Normal rate with regular rhythm. *** murmur.  Pulses ***. *** edema.  ABDOMEN: Soft, non-tender, non-distended.  EXTREMITIES: Warm and well perfused.  NEUROLOGIC: No obvious FND  DATA REVIEW  ECG:NSR  ECHO:  08/12/22: LVEF 50%, LVH.   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  NYHA class / AHA Stage:IIB Volume status & Diuretics: Euvolemic to mildly hypervolemic Vasodilators:Now off clonidine. Continue Entresto 97/103 BID. BP well controlled today. Beta-Blocker:Coreg uptitrated to goal dose at 25mg  BID w/o difficulty.  MRA: increase spironolactone to 25mg  daily.  Cardiometabolic: continue farxiga 10mg   Devices therapies & Valvulopathies:Not indicated at this time; no valvular  disease Advanced therapies:Not indicated; working aggressively on weight loss.   2. Obesity  - Discussed bariatric surgery  - Referring to weight loss clinic.  3. HTN - Entresto 97/103 BID starting today; D/C clonidine & losartan - On follow up will plan to increase coreg and D/C amlodipine and/or HCTZ.   4. Sleep apnea - Tested negative previously. Will hold off further evaluation.    Follow-up:  PRN  Wendy Humphrey Advanced Heart Failure Mechanical Circulatory Support

## 2024-02-08 ENCOUNTER — Ambulatory Visit: Attending: Cardiology | Admitting: Cardiology

## 2024-02-08 ENCOUNTER — Encounter: Payer: Self-pay | Admitting: Cardiology

## 2024-02-08 VITALS — BP 193/130 | HR 89 | Ht 64.0 in | Wt 382.4 lb

## 2024-02-08 DIAGNOSIS — I5022 Chronic systolic (congestive) heart failure: Secondary | ICD-10-CM | POA: Diagnosis not present

## 2024-02-08 DIAGNOSIS — E669 Obesity, unspecified: Secondary | ICD-10-CM | POA: Insufficient documentation

## 2024-02-08 DIAGNOSIS — I10 Essential (primary) hypertension: Secondary | ICD-10-CM | POA: Diagnosis not present

## 2024-02-08 DIAGNOSIS — Z6841 Body Mass Index (BMI) 40.0 and over, adult: Secondary | ICD-10-CM | POA: Insufficient documentation

## 2024-02-08 DIAGNOSIS — Z79899 Other long term (current) drug therapy: Secondary | ICD-10-CM | POA: Insufficient documentation

## 2024-02-08 DIAGNOSIS — J45909 Unspecified asthma, uncomplicated: Secondary | ICD-10-CM | POA: Diagnosis not present

## 2024-02-08 DIAGNOSIS — I110 Hypertensive heart disease with heart failure: Secondary | ICD-10-CM | POA: Insufficient documentation

## 2024-02-08 MED ORDER — SPIRONOLACTONE 25 MG PO TABS: 25.0000 mg | ORAL_TABLET | Freq: Every day | ORAL | 3 refills | Status: AC

## 2024-02-08 MED ORDER — SACUBITRIL-VALSARTAN 49-51 MG PO TABS: 1.0000 | ORAL_TABLET | Freq: Two times a day (BID) | ORAL | 6 refills | Status: DC

## 2024-02-08 NOTE — Patient Instructions (Signed)
 Medication Changes:  START Entresto 49-51mg  (1 tab) two times daily  START Spironolactone 25mg  (1 tab) daily  Lab Work:  Go DOWN to LOWER LEVEL (LL) to have your blood work completed inside of [employer] office.  We will only call you if the results are abnormal or if the provider would like to make medication changes.   Follow-Up in: Please follow up with the [facility] in 1 weeks with Clarisa Kindred, NP.  At the [facility], you and your health needs are our priority. We have a designated team specialized in the treatment of Heart Failure. This Care Team includes your primary Heart Failure Specialized Cardiologist (physician), Advanced Practice Providers (APPs- Physician Assistants and Nurse Practitioners), and Pharmacist who all work together to provide you with the care you need, when you need it.   You may see any of the following providers on your designated Care Team at your next follow up:  Dr. Arvilla Meres Dr. Marca Ancona Dr. Dorthula Nettles Dr. Theresia Bough Clarisa Kindred, FNP Enos Fling, RPH-CPP  Please be sure to bring in all your medications bottles to every appointment.   Need to Contact us:  If you have any questions or concerns before your next appointment please send [place] a message through [place] or call our office at [phone].    TO LEAVE A MESSAGE FOR THE NURSE SELECT OPTION 2, PLEASE LEAVE A MESSAGE INCLUDING: YOUR NAME DATE OF BIRTH CALL BACK NUMBER REASON FOR CALL**this is important as we prioritize the call backs  YOU WILL RECEIVE A CALL BACK THE SAME DAY AS LONG AS YOU CALL BEFORE 4:00 PM

## 2024-02-09 LAB — BASIC METABOLIC PANEL WITH GFR
BUN/Creatinine Ratio: 12 (ref 9–23)
BUN: 13 mg/dL (ref 6–20)
CO2: 26 mmol/L (ref 20–29)
Calcium: 8.7 mg/dL (ref 8.7–10.2)
Chloride: 101 mmol/L (ref 96–106)
Creatinine, Ser: 1.05 mg/dL — ABNORMAL HIGH (ref 0.57–1.00)
Glucose: 129 mg/dL — ABNORMAL HIGH (ref 70–99)
Potassium: 3.1 mmol/L — ABNORMAL LOW (ref 3.5–5.2)
Sodium: 142 mmol/L (ref 134–144)
eGFR: 70 mL/min/{1.73_m2} (ref >59)

## 2024-02-09 LAB — LIPID PANEL
Chol/HDL Ratio: 4.2 ratio (ref 0.0–4.4)
Cholesterol, Total: 188 mg/dL (ref 100–199)
HDL: 45 mg/dL (ref >39)
LDL Chol Calc (NIH): 125 mg/dL — ABNORMAL HIGH (ref 0–99)
Triglycerides: 98 mg/dL (ref 0–149)
VLDL Cholesterol Cal: 18 mg/dL (ref 5–40)

## 2024-02-09 LAB — HEMOGLOBIN A1C
Est. average glucose Bld gHb Est-mCnc: 111 mg/dL
Hgb A1c MFr Bld: 5.5 % (ref 4.8–5.6)

## 2024-02-09 LAB — BRAIN NATRIURETIC PEPTIDE: BNP: 82.1 pg/mL (ref 0.0–100.0)

## 2024-02-13 ENCOUNTER — Encounter (HOSPITAL_COMMUNITY): Payer: Self-pay

## 2024-02-14 ENCOUNTER — Telehealth: Payer: Self-pay | Admitting: Family

## 2024-02-14 NOTE — Progress Notes (Unsigned)
 ADVANCED HEART FAILURE CLINIC NOTE  Referring Physician: [facility]  Primary Care: [facility]  CC: fatigue  HPI: Wendy Humphrey is a 38 y.o. female with history of asthma, uncontrolled HTN, obesity and systolic heart failure presenting today for follow up. According to Ms. Havlik, she was in her usual state of health in late September 2023, when she began to feeling worsening SOB/chest pressure. She initially believed it to be secondary to her asthma, however, when symptoms did not improve she came to [facility] where she was diagnosed with HFrEF w/ LVEF 45-50%. She was started on low doses of GDMT and discharged home.   She presents today for a HF follow-up visit with a chief complaint of fatigue. She specifically denies shortness of breath, chest pain, cough, palpitations, abdominal distention, pedal edema, dizziness or difficulty sleeping. At last visit, entresto 49/51mg  BID & spiro 25mg  daily were restarted.   She started taking potassium 2 days ago as her potassium was low at 3.1.    ROS: All systems negative except what is listed in HPI, PMH and Problem List   Current Outpatient Medications  Medication Sig Dispense Refill   amLODipine (NORVASC) 10 MG tablet Take 10 mg by mouth daily. (Patient not taking: Reported on 02/08/2024)     carvedilol (COREG) 25 MG tablet Take 1 tablet (25 mg total) by mouth 2 (two) times daily with a meal. (Patient not taking: Reported on 02/08/2024) 180 tablet 3   dapagliflozin propanediol (FARXIGA) 10 MG TABS tablet Take 1 tablet (10 mg total) by mouth daily before breakfast. 30 tablet 6   doxycycline (VIBRAMYCIN) 100 MG capsule Take 1 capsule (100 mg total) by mouth 2 (two) times daily. (Patient not taking: Reported on 02/08/2024) 14 capsule 0   ferrous sulfate 325 (65 FE) MG tablet Take 1 tablet (325 mg total) by mouth daily with breakfast. (Patient not taking: Reported on 02/08/2024) 30 tablet 2   hydrochlorothiazide  (HYDRODIURIL) 25 MG tablet TAKE 1 TABLET(25 MG) BY MOUTH DAILY 90 tablet 3   levothyroxine (SYNTHROID) 150 MCG tablet Take 150 mcg by mouth daily.     levothyroxine (SYNTHROID) 25 MCG tablet Take 25 mcg by mouth daily before breakfast. (Patient not taking: Reported on 02/08/2024)     OVER THE COUNTER MEDICATION Take 2 capsules by mouth daily. Sea Moss Capsule (Patient not taking: Reported on 02/08/2024)     OVER THE COUNTER MEDICATION Take 2 capsules by mouth daily. Beet Root     potassium chloride SA (KLOR-CON M) 20 MEQ tablet Take 2 tablets (40 mEq total) by mouth daily. (Patient not taking: Reported on 02/08/2024) 180 tablet 3   PROAIR HFA 108 (90 BASE) MCG/ACT inhaler Inhale 2 puffs into the lungs daily as needed for wheezing or shortness of breath.  0   sacubitril-valsartan (ENTRESTO) 49-51 MG Take 1 tablet by mouth 2 (two) times daily. 60 tablet 6   spironolactone (ALDACTONE) 25 MG tablet Take 1 tablet (25 mg total) by mouth daily. 90 tablet 3   sulfamethoxazole-trimethoprim (BACTRIM DS) 800-160 MG tablet Take 1 tablet by mouth 2 (two) times daily. (Patient not taking: Reported on 02/08/2024) 6 tablet 0   Vitamin D, Ergocalciferol, (DRISDOL) 1.25 MG (50000 UNIT) CAPS capsule Take 50,000 Units by mouth once a week. On Mondays (Patient not taking: Reported on 02/08/2024)     No current facility-administered medications for this visit.   Vitals:   02/15/24 1504  BP: (!) 176/108  Pulse: 74  SpO2: 100%  Weight: (!) 380 lb 3.2 oz (172.5 kg)   Wt Readings from Last 3 Encounters:  02/15/24 (!) 380 lb 3.2 oz (172.5 kg)  02/08/24 (!) 382 lb 6.4 oz (173.5 kg)  06/01/23 (!) 387 lb 6.4 oz (175.7 kg)   Lab Results  Component Value Date   CREATININE 1.05 (H) 02/08/2024   CREATININE 1.01 (H) 10/23/2022   CREATININE 1.22 (H) 08/28/2022    PHYSICAL EXAM:  General: Well appearing, obese. No resp difficulty HEENT: normal Neck: supple, no JVD Cor: Regular rhythm, rate. No rubs, gallops or  murmurs Lungs: clear Abdomen: soft, nontender, nondistended. Extremities: no cyanosis, clubbing, rash, edema Neuro: alert & oriented X 3. Moves all 4 extremities w/o difficulty. Affect pleasant   DATA REVIEW  ECG:NSR  ECHO:  08/12/22: LVEF 50%, LVH.   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  NYHA class / AHA Stage:IIB Volume status & Diuretics: Euvolemic Vasodilators:Entresto 49/51mg  BID was restarted at last visit; will increase this today to 97/103mg  BID.  Beta-Blocker:Continue carvedilol 25mg  BID  MRA: Continue spironolactone 25mg  daily.  Cardiometabolic: continue farxiga 10mg   Devices therapies & Valvulopathies:Not indicated at this time; no valvular disease Advanced therapies: Not indicated; working aggressively on weight loss.  - weight down 2 pounds from last visit here 1 week ago - BNP 02/08/24 was 82.1  2. Obesity  - She is undergoing evaluation for bariatric surgery. - A1c 02/08/24 was 5.5% - insurance does not cover GLP1's  3. HTN - BP 176/108, increasing entresto per above (last week BP was 193/130) - BMET 02/08/24 showed sodium 142, potassium 3.1, creatinine 1.05 & GFR 70 - BMET today - discussed referring to HTN clinic if BP doesn't look markedly better at next visit  4. Sleep apnea - Tested negative previously. Will hold off further evaluation.   5: Hyperlipidemia- - begin atorvastatin 20mg  daily - LDL 02/08/24 was 125; recheck this in 6-8 weeks - reviewed decreasing consumption of fried / fatty foods   Return in 2 weeks, sooner if needed  Clarisa Kindred, [place] 02/15/24

## 2024-02-14 NOTE — Telephone Encounter (Incomplete)
 Called to confirm/remind patient of their appointment at the Advanced [facility] Failure Clinic on 02/15/24***.   Appointment:   [x] Confirmed  [] Left mess   [] No answer/No voice mail  [] Phone not in service  Patient reminded to bring all medications and/or complete list.  Confirmed patient has transportation. Gave directions, instructed to utilize valet parking.

## 2024-02-15 ENCOUNTER — Other Ambulatory Visit (HOSPITAL_COMMUNITY): Payer: Self-pay

## 2024-02-15 ENCOUNTER — Encounter: Payer: Self-pay | Admitting: Family

## 2024-02-15 ENCOUNTER — Ambulatory Visit: Admitting: Family

## 2024-02-15 ENCOUNTER — Other Ambulatory Visit
Admission: RE | Admit: 2024-02-15 | Discharge: 2024-02-15 | Disposition: A | Discharge status: Home / Self Care | Source: Ambulatory Visit | Attending: Family | Admitting: Family

## 2024-02-15 VITALS — BP 162/102 | HR 74 | Wt 380.2 lb

## 2024-02-15 DIAGNOSIS — I10 Essential (primary) hypertension: Secondary | ICD-10-CM

## 2024-02-15 DIAGNOSIS — Z6841 Body Mass Index (BMI) 40.0 and over, adult: Secondary | ICD-10-CM

## 2024-02-15 DIAGNOSIS — G4730 Sleep apnea, unspecified: Secondary | ICD-10-CM | POA: Diagnosis not present

## 2024-02-15 DIAGNOSIS — Z7984 Long term (current) use of oral hypoglycemic drugs: Secondary | ICD-10-CM | POA: Diagnosis not present

## 2024-02-15 DIAGNOSIS — I5022 Chronic systolic (congestive) heart failure: Secondary | ICD-10-CM

## 2024-02-15 DIAGNOSIS — E669 Obesity, unspecified: Secondary | ICD-10-CM | POA: Insufficient documentation

## 2024-02-15 DIAGNOSIS — E7849 Other hyperlipidemia: Secondary | ICD-10-CM

## 2024-02-15 DIAGNOSIS — R5383 Other fatigue: Secondary | ICD-10-CM | POA: Diagnosis not present

## 2024-02-15 DIAGNOSIS — J45909 Unspecified asthma, uncomplicated: Secondary | ICD-10-CM | POA: Insufficient documentation

## 2024-02-15 DIAGNOSIS — E785 Hyperlipidemia, unspecified: Secondary | ICD-10-CM | POA: Diagnosis not present

## 2024-02-15 DIAGNOSIS — I429 Cardiomyopathy, unspecified: Secondary | ICD-10-CM | POA: Diagnosis not present

## 2024-02-15 DIAGNOSIS — Z79899 Other long term (current) drug therapy: Secondary | ICD-10-CM | POA: Insufficient documentation

## 2024-02-15 DIAGNOSIS — I110 Hypertensive heart disease with heart failure: Secondary | ICD-10-CM | POA: Diagnosis not present

## 2024-02-15 LAB — BASIC METABOLIC PANEL WITH GFR
Anion gap: 10 (ref 5–15)
BUN: 15 mg/dL (ref 6–20)
CO2: 30 mmol/L (ref 22–32)
Calcium: 8.8 mg/dL — ABNORMAL LOW (ref 8.9–10.3)
Chloride: 98 mmol/L (ref 98–111)
Creatinine, Ser: 1.21 mg/dL — ABNORMAL HIGH (ref 0.44–1.00)
GFR, Estimated: 59 mL/min — ABNORMAL LOW (ref >60)
Glucose, Bld: 101 mg/dL — ABNORMAL HIGH (ref 70–99)
Potassium: 3 mmol/L — ABNORMAL LOW (ref 3.5–5.1)
Sodium: 138 mmol/L (ref 135–145)

## 2024-02-15 MED ORDER — ATORVASTATIN CALCIUM 20 MG PO TABS: 20.0000 mg | ORAL_TABLET | Freq: Every day | ORAL | 3 refills | Status: AC

## 2024-02-15 MED ORDER — SACUBITRIL-VALSARTAN 97-103 MG PO TABS: 1.0000 | ORAL_TABLET | Freq: Two times a day (BID) | ORAL | 6 refills | Status: AC

## 2024-02-15 NOTE — Patient Instructions (Signed)
 Medication Changes:  START Atorvastatin 20mg  (1 TAB) daily  INCREASE Entresto 97-103mg  (1 tab) two times daily Can finnish current dose as 2 tabs two times daily  Lab Work:  Go over to the [facility]. Go pass the gift shop and have your blood work completed.  We will only call you if the results are abnormal or if the provider would like to make medication changes.   Follow-Up in: Please follow up with the Advanced Heart Failure in 2 weeks with Clarisa Kindred, NP.  At the [facility] Clinic, you and your health needs are our priority. We have a designated team specialized in the treatment of Heart Failure. This Care Team includes your primary Heart Failure Specialized Cardiologist (physician), Advanced Practice Providers (APPs- Physician Assistants and Nurse Practitioners), and Pharmacist who all work together to provide you with the care you need, when you need it.   You may see any of the following providers on your designated Care Team at your next follow up:  Dr. Arvilla Meres Dr. Marca Ancona Dr. Dorthula Nettles Dr. Theresia Bough Clarisa Kindred, FNP Enos Fling, RPH-CPP  Please be sure to bring in all your medications bottles to every appointment.   Need to Contact us:  If you have any questions or concerns before your next appointment please send [place] a message through [place] or call our office at [phone].    TO LEAVE A MESSAGE FOR THE NURSE SELECT OPTION 2, PLEASE LEAVE A MESSAGE INCLUDING: YOUR NAME DATE OF BIRTH CALL BACK NUMBER REASON FOR CALL**this is important as we prioritize the call backs  YOU WILL RECEIVE A CALL BACK THE SAME DAY AS LONG AS YOU CALL BEFORE 4:00 PM

## 2024-02-28 NOTE — Progress Notes (Deleted)
 ADVANCED HEART FAILURE CLINIC NOTE  Referring Physician: [facility]  Primary Care: [facility]  CC: fatigue  HPI: Wendy Humphrey is a 38 y.o. female with history of asthma, uncontrolled HTN, obesity and systolic heart failure presenting today for follow up. According to Wendy Humphrey, she was in her usual state of health in late September 2023, when she began to feeling worsening SOB/chest pressure. She initially believed it to be secondary to her asthma, however, when symptoms did not improve she came to [facility] where she was diagnosed with HFrEF w/ LVEF 45-50%. She was started on low doses of GDMT and discharged home.   She presents today for a HF follow-up visit with a chief complaint of fatigue. She specifically denies shortness of breath, chest pain, cough, palpitations, abdominal distention, pedal edema, dizziness or difficulty sleeping. At last visit, entresto  49/51mg  BID & spiro 25mg  daily were restarted.   She started taking potassium 2 days ago as her potassium was low at 3.1.    ROS: All systems negative except what is listed in HPI, PMH and Problem List   Current Outpatient Medications  Medication Sig Dispense Refill   atorvastatin  (LIPITOR) 20 MG tablet Take 1 tablet (20 mg total) by mouth daily. 90 tablet 3   carvedilol  (COREG ) 25 MG tablet Take 1 tablet (25 mg total) by mouth 2 (two) times daily with a meal. 180 tablet 3   dapagliflozin  propanediol (FARXIGA ) 10 MG TABS tablet Take 1 tablet (10 mg total) by mouth daily before breakfast. 30 tablet 6   ferrous sulfate  325 (65 FE) MG tablet Take 1 tablet (325 mg total) by mouth daily with breakfast. 30 tablet 2   hydrochlorothiazide  (HYDRODIURIL ) 25 MG tablet TAKE 1 TABLET(25 MG) BY MOUTH DAILY 90 tablet 3   levothyroxine  (SYNTHROID ) 150 MCG tablet Take 150 mcg by mouth daily.     levothyroxine  (SYNTHROID ) 25 MCG tablet Take 25 mcg by mouth daily before breakfast.     OVER THE COUNTER  MEDICATION Take 2 capsules by mouth daily. Sea Moss Capsule     OVER THE COUNTER MEDICATION Take 2 capsules by mouth daily. Beet Root     potassium chloride  SA (KLOR-CON  M) 20 MEQ tablet Take 2 tablets (40 mEq total) by mouth daily. 180 tablet 3   PROAIR  HFA 108 (90 BASE) MCG/ACT inhaler Inhale 2 puffs into the lungs daily as needed for wheezing or shortness of breath.  0   sacubitril -valsartan  (ENTRESTO ) 97-103 MG Take 1 tablet by mouth 2 (two) times daily. 60 tablet 6   spironolactone  (ALDACTONE ) 25 MG tablet Take 1 tablet (25 mg total) by mouth daily. 90 tablet 3   Vitamin D, Ergocalciferol, (DRISDOL) 1.25 MG (50000 UNIT) CAPS capsule Take 50,000 Units by mouth once a week. On Mondays     No current facility-administered medications for this visit.   There were no vitals filed for this visit.  Wt Readings from Last 3 Encounters:  02/15/24 (!) 380 lb 3.2 oz (172.5 kg)  02/08/24 (!) 382 lb 6.4 oz (173.5 kg)  06/01/23 (!) 387 lb 6.4 oz (175.7 kg)   Lab Results  Component Value Date   CREATININE 1.21 (H) 02/15/2024   CREATININE 1.05 (H) 02/08/2024   CREATININE 1.01 (H) 10/23/2022    PHYSICAL EXAM:  General: Well appearing, obese. No resp difficulty HEENT: normal Neck: supple, no JVD Cor: Regular rhythm, rate. No rubs, gallops or murmurs Lungs: clear Abdomen: soft, nontender, nondistended. Extremities: no cyanosis,  clubbing, rash, edema Neuro: alert & oriented X 3. Moves all 4 extremities w/o difficulty. Affect pleasant   DATA REVIEW  ECG:NSR  ECHO:  08/12/22: LVEF 50%, LVH.   CATH:No prior cath   ASSESSMENT & PLAN:  Stage C, NYHA IIB, Heart failure with reduced ejection fraction Etiology of WU:JWJXBJ nonischemic hypertensive cardiomyopathy; no symptoms of angina and at [name] CAD is highly unlikely.  NYHA class / AHA Stage:IIB Volume status & Diuretics: Euvolemic Vasodilators:Entresto  49/51mg  BID was restarted at last visit; will increase this today to 97/103mg  BID.   Beta-Blocker:Continue carvedilol  25mg  BID  MRA: Continue spironolactone  25mg  daily.  Cardiometabolic: continue farxiga  10mg   Devices therapies & Valvulopathies:Not indicated at this time; no valvular disease Advanced therapies: Not indicated; working aggressively on weight loss.  - weight down 2 pounds from last visit here 1 week ago - BNP 02/08/24 was 82.1  2. Obesity  - She is undergoing evaluation for bariatric surgery. - A1c 02/08/24 was 5.5% - insurance does not cover GLP1's  3. HTN - BP 176/108, increasing entresto  per above (last week BP was 193/130) - BMET 02/08/24 showed sodium 142, potassium 3.1, creatinine 1.05 & GFR 70 - BMET today - discussed referring to HTN clinic if BP doesn't look markedly better at next visit  4. Sleep apnea - Tested negative previously. Will hold off further evaluation.   5: Hyperlipidemia- - begin atorvastatin  20mg  daily - LDL 02/08/24 was 125; recheck this in 6-8 weeks - reviewed decreasing consumption of fried / fatty foods   Return in 2 weeks, sooner if needed  Shawnee Dellen, [place] 02/15/24

## 2024-02-29 ENCOUNTER — Encounter: Admitting: Family

## 2024-03-18 ENCOUNTER — Telehealth: Payer: Self-pay | Admitting: Family

## 2024-03-18 NOTE — Telephone Encounter (Signed)
Called to r/s missed appt

## 2024-04-01 ENCOUNTER — Encounter: Payer: Self-pay | Admitting: Family

## 2024-10-23 NOTE — Progress Notes (Unsigned)
 ADVANCED HEART FAILURE CLINIC NOTE  Referring Physician: [facility]  Primary Care: [facility]  CC: shortness of breath   HPI: Wendy Humphrey is a 38 y.o. female with history of asthma, uncontrolled HTN, obesity and systolic heart failure presenting today for follow up. According to Wendy Humphrey, she was in her usual state of health in late September 2023, when she began to feeling worsening SOB/chest pressure. She initially believed it to be secondary to her asthma, however, when symptoms did not improve she came to [facility] where she was diagnosed with HFrEF w/ LVEF 45-50%. She was started on low doses of GDMT and discharged home.   Seen in [facility] 02/15/24 where entresto  was increased to 97/103mg  BID. Subsequent labs showed low potassium (3.0) and she was called to take additional potassium. Has not returned for OV or labs since that time.   She presents today for a HF follow-up visit with a chief complaint of moderate shortness of breath with little exertion. Has associated rare palpitations, pedal edema, continued weight gain. Unknown if she snores or has apneic episodes. Denies fatigue, chest pain, dizziness. Has been out of hydralazine/ farxiga  for a couple of months. Has been taking all her meds once daily including her carvedilol  and entresto  as she didn't realize she needed to take them twice daily.   When asked how it's been 8 months since she was last here, she says that she was just tired of everything but now realizes that she's only 38 y/o old and needs to take care of herself. Is asking about weight loss injections and 3rd party sites to get it from. Would do bariatric surgery if she needed to.  ROS: All systems negative except what is listed in HPI, PMH and Problem List   Current Outpatient Medications  Medication Sig Dispense Refill   atorvastatin  (LIPITOR) 20 MG tablet Take 1 tablet (20 mg total) by mouth daily. 90 tablet 3   carvedilol   (COREG ) 25 MG tablet Take 1 tablet (25 mg total) by mouth 2 (two) times daily with a meal. 180 tablet 3   dapagliflozin  propanediol (FARXIGA ) 10 MG TABS tablet Take 1 tablet (10 mg total) by mouth daily before breakfast. 30 tablet 6   ferrous sulfate  325 (65 FE) MG tablet Take 1 tablet (325 mg total) by mouth daily with breakfast. 30 tablet 2   hydrochlorothiazide  (HYDRODIURIL ) 25 MG tablet TAKE 1 TABLET(25 MG) BY MOUTH DAILY 90 tablet 3   levothyroxine  (SYNTHROID ) 150 MCG tablet Take 150 mcg by mouth daily.     levothyroxine  (SYNTHROID ) 25 MCG tablet Take 25 mcg by mouth daily before breakfast.     OVER THE COUNTER MEDICATION Take 2 capsules by mouth daily. Sea Moss Capsule     OVER THE COUNTER MEDICATION Take 2 capsules by mouth daily. Beet Root     potassium chloride  SA (KLOR-CON  M) 20 MEQ tablet Take 2 tablets (40 mEq total) by mouth daily. 180 tablet 3   PROAIR  HFA 108 (90 BASE) MCG/ACT inhaler Inhale 2 puffs into the lungs daily as needed for wheezing or shortness of breath.  0   sacubitril -valsartan  (ENTRESTO ) 97-103 MG Take 1 tablet by mouth 2 (two) times daily. 60 tablet 6   spironolactone  (ALDACTONE ) 25 MG tablet Take 1 tablet (25 mg total) by mouth daily. 90 tablet 3   Vitamin D, Ergocalciferol, (DRISDOL) 1.25 MG (50000 UNIT) CAPS capsule Take 50,000 Units by mouth once a week. On Mondays  No current facility-administered medications for this visit.   Vitals:   10/24/24 1206  BP: (!) 198/120  Pulse: 89  SpO2: 99%  Weight: (!) 392 lb (177.8 kg)   Wt Readings from Last 3 Encounters:  10/24/24 (!) 392 lb (177.8 kg)  02/15/24 (!) 380 lb 3.2 oz (172.5 kg)  02/08/24 (!) 382 lb 6.4 oz (173.5 kg)   Lab Results  Component Value Date   CREATININE 1.21 (H) 02/15/2024   CREATININE 1.05 (H) 02/08/2024   CREATININE 1.01 (H) 10/23/2022    PHYSICAL EXAM:  General: Well appearing, obese female.  Cor: No JVD. Regular rhythm, rate.  Lungs: clear Abdomen: soft, nontender,  nondistended. Extremities: trace pitting edema Neuro:. Affect pleasant  DATA REVIEW  ECG:NSR  ECHO:  08/12/22: LVEF 50%, LVH.   CATH:No prior cath   ASSESSMENT & PLAN:  Stage C, NYHA IIB, Heart failure with reduced ejection fraction Etiology of YQ:Opxzob nonischemic hypertensive cardiomyopathy; no symptoms of angina and at [name] CAD is highly unlikely.  NYHA class / AHA Stage:IIB Volume status & Diuretics: Resume hydrochlorothiazide  25mg  daily Vasodilators:Continue Entresto  97/103mg  BID.  Beta-Blocker:Continue carvedilol  25mg  BID  MRA: Continue spironolactone  25mg  daily.  Cardiometabolic: continue farxiga  10mg   Devices therapies & Valvulopathies:Not indicated at this time; no valvular disease Advanced therapies: Not indicated - weight up 12 pounds from last visit here 8 months ago Says that she's committed to getting her health under control - BNP 02/08/24 was 82.1  2. Obesity  - Would consider bariatric surgery if needed - A1c 02/08/24 was 5.5% - Have reached out to HF pharm tech to see if insurance could cover GLP1's now. Will order wegovy and do sleep study  - The patient has had at least 3 months of lifestyle modifications prior to starting the requested medication,  - The patient is using the requested medication in combination with a reduced calorie diet and increased physical activity,  - The patient will continue lifestyle modification including structured nutrition and physical activity, unless physical activity is not clinically appropriate at the time GLP1 therapy commences?  3. HTN - BP 198/120. Has only been taking carvedilol / entresto  daily. Emphasized taking it BID - resumed hydrochlorothiazide  and started hydralazine - BMET 02/15/24 reviewed: sodium 138, potassium 3.0, creatinine 1.21 & GFR 59 - BMET today - Begin hydralazine 25mg  BID, plan to titrate at next visit. TID dosing would be difficult for patient - referral placed to HTN clinic today  4. Sleep  apnea - Tested negative previously.  - in facility sleep study order placed today  5: Hyperlipidemia- - continue atorvastatin  20mg  daily - LDL 02/08/24 was 125 - reviewed decreasing consumption of fried / fatty foods   Return in 1 month, sooner if needed.   I spent 38 minutes reviewing records, interviewing/ examing patient and managing plan/ orders.   Ellouise Class, FNP-C 10/24/2024

## 2024-10-24 ENCOUNTER — Encounter: Payer: Self-pay | Admitting: Family

## 2024-10-24 ENCOUNTER — Telehealth: Payer: Self-pay

## 2024-10-24 ENCOUNTER — Other Ambulatory Visit (HOSPITAL_COMMUNITY): Payer: Self-pay

## 2024-10-24 ENCOUNTER — Ambulatory Visit: Admitting: Family

## 2024-10-24 ENCOUNTER — Other Ambulatory Visit
Admission: RE | Admit: 2024-10-24 | Discharge: 2024-10-24 | Disposition: A | Source: Ambulatory Visit | Attending: Family | Admitting: Family

## 2024-10-24 VITALS — BP 198/120 | HR 89 | Wt 392.0 lb

## 2024-10-24 DIAGNOSIS — Z6841 Body Mass Index (BMI) 40.0 and over, adult: Secondary | ICD-10-CM | POA: Diagnosis not present

## 2024-10-24 DIAGNOSIS — G4730 Sleep apnea, unspecified: Secondary | ICD-10-CM

## 2024-10-24 DIAGNOSIS — E7849 Other hyperlipidemia: Secondary | ICD-10-CM

## 2024-10-24 DIAGNOSIS — I10 Essential (primary) hypertension: Secondary | ICD-10-CM | POA: Diagnosis not present

## 2024-10-24 DIAGNOSIS — I5022 Chronic systolic (congestive) heart failure: Secondary | ICD-10-CM

## 2024-10-24 MED ORDER — HYDRALAZINE HCL 25 MG PO TABS: 25.0000 mg | ORAL_TABLET | Freq: Two times a day (BID) | ORAL | 1 refills | Status: AC

## 2024-10-24 MED ORDER — DAPAGLIFLOZIN PROPANEDIOL 10 MG PO TABS: 10.0000 mg | ORAL_TABLET | Freq: Every day | ORAL | 3 refills | Status: AC

## 2024-10-24 MED ORDER — HYDROCHLOROTHIAZIDE 25 MG PO TABS: 25.0000 mg | ORAL_TABLET | Freq: Every day | ORAL | 1 refills | Status: AC

## 2024-10-24 NOTE — Patient Instructions (Addendum)
 Medication Changes:  PLEASE TAKE Entresto  and Carvedilol  1 tab TWO TIMES DAILY  START Hydralazine 25mg  (1 tab) two times daily    Testing/Procedures:  You have been ordered an in facility sleep study. Someone will contact you in order to schedule your appointment.  Referrals:  You have been referred to [facility] with Dr. Raford for the hypertension clinic. This office will contact you in order to schedule your appointment.    Follow-Up in: Please follow up with the Advanced Heart Failure Clinic in 1 month with Ellouise Class, FNP.   Thank you for choosing [facility].    At the Advanced Heart Failure Clinic, you and your health needs are our priority. We have a designated team specialized in the treatment of Heart Failure. This Care Team includes your primary Heart Failure Specialized Cardiologist (physician), Advanced Practice Providers (APPs- Physician Assistants and Nurse Practitioners), and Pharmacist who all work together to provide you with the care you need, when you need it.   You may see any of the following providers on your designated Care Team at your next follow up:  Dr. Toribio Fuel Dr. Ezra Shuck Dr. Ria Commander Dr. Morene Brownie Ellouise Class, FNP Jaun Bash, RPH-CPP  Please be sure to bring in all your medications bottles to every appointment.   Need to Contact Us :  If you have any questions or concerns before your next appointment please send us  a message through [place] or call our office at [phone].    TO LEAVE A MESSAGE FOR THE NURSE SELECT OPTION 2, PLEASE LEAVE A MESSAGE INCLUDING: YOUR NAME DATE OF BIRTH CALL BACK NUMBER REASON FOR CALL**this is important as we prioritize the call backs  YOU WILL RECEIVE A CALL BACK THE SAME DAY AS LONG AS YOU CALL BEFORE 4:00 PM

## 2024-10-27 NOTE — Telephone Encounter (Signed)
 Advanced Heart Failure Patient Advocate Encounter  Prior authorization for Wegovy [facility]) denied for not meeting plan requirements. Patient age must be 40 or older for approval with this coverage. Patient may be eligible for Zepbound prior authorization once sleep study is completed. Office staff informed.  Rachel DEL, CPhT Rx Patient Advocate Phone: [phone]

## 2024-11-26 ENCOUNTER — Telehealth: Payer: Self-pay | Admitting: Family

## 2024-11-26 NOTE — Progress Notes (Unsigned)
 "  ADVANCED HEART FAILURE CLINIC NOTE  Referring Physician: [facility]  Primary Care: [facility]  CC:   HPI: Wendy Humphrey is a 39 y.o. female with history of asthma, uncontrolled HTN, obesity and systolic heart failure presenting today for follow up. According to [name], she was in her usual state of health in late September 2023, when she began to feeling worsening SOB/chest pressure. She initially believed it to be secondary to her asthma, however, when symptoms did not improve she came to [facility] where she was diagnosed with HFrEF w/ LVEF 45-50%. She was started on low doses of GDMT and discharged home.   Seen in [facility] 02/15/24 where entresto  was increased to 97/103mg  BID. Subsequent labs showed low potassium (3.0) and she was called to take additional potassium. Has not returned for OV or labs since that time.   She presents today for a HF follow-up visit with a chief complaint of     ROS: All systems negative except what is listed in HPI, PMH and Problem List   Current Outpatient Medications  Medication Sig Dispense Refill   atorvastatin  (LIPITOR) 20 MG tablet Take 1 tablet (20 mg total) by mouth daily. 90 tablet 3   carvedilol  (COREG ) 25 MG tablet Take 1 tablet (25 mg total) by mouth 2 (two) times daily with a meal. 180 tablet 3   dapagliflozin  propanediol (FARXIGA ) 10 MG TABS tablet Take 1 tablet (10 mg total) by mouth daily before breakfast. 90 tablet 3   ferrous sulfate  325 (65 FE) MG tablet Take 1 tablet (325 mg total) by mouth daily with breakfast. 30 tablet 2   hydrALAZINE  (APRESOLINE ) 25 MG tablet Take 1 tablet (25 mg total) by mouth in the morning and at bedtime. 180 tablet 1   hydrochlorothiazide  (HYDRODIURIL ) 25 MG tablet Take 1 tablet (25 mg total) by mouth daily. 90 tablet 1   levothyroxine  (SYNTHROID ) 150 MCG tablet Take 150 mcg by mouth daily.     levothyroxine  (SYNTHROID ) 25 MCG tablet Take 25 mcg by mouth daily before  breakfast.     potassium chloride  SA (KLOR-CON  M) 20 MEQ tablet Take 2 tablets (40 mEq total) by mouth daily. 180 tablet 3   PROAIR  HFA 108 (90 BASE) MCG/ACT inhaler Inhale 2 puffs into the lungs daily as needed for wheezing or shortness of breath.  0   sacubitril -valsartan  (ENTRESTO ) 97-103 MG Take 1 tablet by mouth 2 (two) times daily. 60 tablet 6   spironolactone  (ALDACTONE ) 25 MG tablet Take 1 tablet (25 mg total) by mouth daily. 90 tablet 3   Vitamin D, Ergocalciferol, (DRISDOL) 1.25 MG (50000 UNIT) CAPS capsule Take 50,000 Units by mouth once a week. On Mondays     No current facility-administered medications for this visit.      PHYSICAL EXAM:  General: Well appearing, obese female.  Cor: No JVD. Regular rhythm, rate.  Lungs: clear Abdomen: soft, nontender, nondistended. Extremities: trace pitting edema Neuro:. Affect pleasant  DATA REVIEW  ECG:NSR  ECHO:  08/12/22: LVEF 50%, LVH.  11/28/22: LVEF 50-55%, severe LVH, G2DD, normal RV  CATH:No prior cath   ASSESSMENT & PLAN:  Stage C, NYHA IIB, Heart failure with preserved ejection fraction Etiology of YQ:Opxzob nonischemic hypertensive cardiomyopathy; no symptoms of angina and at [name] CAD is highly unlikely.  NYHA class / AHA Stage:IIB Volume status & Diuretics:  Continue hydrochlorothiazide  25mg  daily Vasodilators:Continue Entresto  97/103mg  BID.  Beta-Blocker:Continue carvedilol  25mg  BID  MRA: Continue spironolactone  25mg  daily.  Cardiometabolic: continue farxiga  10mg   Devices therapies & Valvulopathies:Not indicated at this time; no valvular disease Advanced therapies: Not indicated - weight 392 pounds from last visit here 1 month ago  - BNP 02/08/24 was 82.1  2. Obesity  - Would consider bariatric surgery if needed - A1c 02/08/24 was 5.5% - The patient has had at least 3 months of lifestyle modifications prior to starting the requested medication,  - The patient is using the requested medication in combination  with a reduced calorie diet and increased physical activity,  - The patient will continue lifestyle modification including structured nutrition and physical activity, unless physical activity is not clinically appropriate at the time GLP1 therapy commences?  3. HTN - BP  - BMET 02/15/24 reviewed: sodium 138, potassium 3.0, creatinine 1.21 & GFR 59 - referral placed to HTN clinic today  4. Sleep apnea - Tested negative previously.  - in facility sleep study order placed today  5: Hyperlipidemia- - continue atorvastatin  20mg  daily - LDL 02/08/24 was 125 - reviewed decreasing consumption of fried / fatty foods     Ellouise Class, FNP-C 11/26/2024  "

## 2024-11-26 NOTE — Telephone Encounter (Signed)
 Called to confirm/remind patient of their appointment at the [facility] on 11/27/24.   Appointment:   [] Confirmed  [x] Left mess   [] No answer/No voice mail  [] VM Full/unable to leave message  [] Phone not in service  Patient reminded to bring all medications and/or complete list.  Confirmed patient has transportation. Gave directions, instructed to utilize valet parking.

## 2024-11-27 ENCOUNTER — Ambulatory Visit: Admitting: Family

## 2024-12-11 ENCOUNTER — Telehealth: Payer: Self-pay | Admitting: Family

## 2024-12-11 NOTE — Telephone Encounter (Signed)
 Called to confirm/remind patient of their appointment at the [facility] on 12/12/24.   Appointment:   [] Confirmed  [x] Left mess   [] No answer/No voice mail  [] VM Full/unable to leave message  [] Phone not in service  Patient reminded to bring all medications and/or complete list.  Confirmed patient has transportation. Gave directions, instructed to utilize valet parking.

## 2024-12-12 ENCOUNTER — Ambulatory Visit: Admitting: Family

## 2024-12-15 ENCOUNTER — Encounter (HOSPITAL_BASED_OUTPATIENT_CLINIC_OR_DEPARTMENT_OTHER): Payer: Self-pay
